# Patient Record
Sex: Female | Born: 1970 | Race: White | Hispanic: No | Smoking: Current some day smoker
Health system: Southern US, Community
[De-identification: ages and names within clinical notes are randomized; demographics above are authoritative.]

## PROBLEM LIST (undated history)

## (undated) DIAGNOSIS — F32A Depression, unspecified: Secondary | ICD-10-CM

## (undated) DIAGNOSIS — F329 Major depressive disorder, single episode, unspecified: Secondary | ICD-10-CM

## (undated) DIAGNOSIS — I10 Essential (primary) hypertension: Secondary | ICD-10-CM

## (undated) HISTORY — PX: BREAST SURGERY: SHX581

## (undated) HISTORY — PX: MOUTH SURGERY: SHX715

## (undated) HISTORY — PX: TONSILLECTOMY: SUR1361

## (undated) HISTORY — PX: AUGMENTATION MAMMAPLASTY: SUR837

---
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
## 2000-08-05 ENCOUNTER — Other Ambulatory Visit: Admission: RE | Admit: 2000-08-05 | Discharge: 2000-08-05 | Payer: Self-pay | Admitting: Obstetrics and Gynecology

## 2001-01-18 ENCOUNTER — Observation Stay (HOSPITAL_COMMUNITY): Admission: AD | Admit: 2001-01-18 | Discharge: 2001-01-19 | Payer: Self-pay | Admitting: Obstetrics and Gynecology

## 2001-01-19 ENCOUNTER — Encounter: Payer: Self-pay | Admitting: Obstetrics and Gynecology

## 2001-02-02 ENCOUNTER — Inpatient Hospital Stay (HOSPITAL_COMMUNITY): Admission: AD | Admit: 2001-02-02 | Discharge: 2001-02-04 | Payer: Self-pay | Admitting: Obstetrics and Gynecology

## 2001-03-15 ENCOUNTER — Other Ambulatory Visit: Admission: RE | Admit: 2001-03-15 | Discharge: 2001-03-15 | Payer: Self-pay | Admitting: Obstetrics and Gynecology

## 2002-07-05 ENCOUNTER — Other Ambulatory Visit: Admission: RE | Admit: 2002-07-05 | Discharge: 2002-07-05 | Payer: Self-pay | Admitting: Obstetrics and Gynecology

## 2003-05-27 ENCOUNTER — Inpatient Hospital Stay (HOSPITAL_COMMUNITY): Admission: AD | Admit: 2003-05-27 | Discharge: 2003-05-27 | Payer: Self-pay | Admitting: Pediatrics

## 2003-06-01 ENCOUNTER — Inpatient Hospital Stay (HOSPITAL_COMMUNITY): Admission: AD | Admit: 2003-06-01 | Discharge: 2003-06-02 | Payer: Self-pay | Admitting: Obstetrics & Gynecology

## 2003-07-17 ENCOUNTER — Other Ambulatory Visit: Admission: RE | Admit: 2003-07-17 | Discharge: 2003-07-17 | Payer: Self-pay | Admitting: Obstetrics and Gynecology

## 2004-10-12 ENCOUNTER — Other Ambulatory Visit: Admission: RE | Admit: 2004-10-12 | Discharge: 2004-10-12 | Payer: Self-pay | Admitting: Obstetrics and Gynecology

## 2006-05-10 ENCOUNTER — Ambulatory Visit (HOSPITAL_COMMUNITY): Admission: RE | Admit: 2006-05-10 | Discharge: 2006-05-10 | Payer: Self-pay | Admitting: Plastic Surgery

## 2006-05-10 ENCOUNTER — Ambulatory Visit (HOSPITAL_COMMUNITY): Admission: RE | Admit: 2006-05-10 | Discharge: 2006-05-10 | Payer: Self-pay | Admitting: Urology

## 2007-07-09 ENCOUNTER — Emergency Department (HOSPITAL_COMMUNITY): Admission: EM | Admit: 2007-07-09 | Discharge: 2007-07-09 | Payer: Self-pay | Admitting: Emergency Medicine

## 2010-09-18 NOTE — Op Note (Signed)
Gina Fuentes, SCALES NO.:  [id number]   MEDICAL RECORD NO.:  [id number]          PATIENT TYPE:  AMB   LOCATION:  SDC                           FACILITY:  [facility]   PHYSICIAN:  Consuello Bossier., M.D.DATE OF BIRTH:  01/23/1971   DATE OF PROCEDURE:  05/10/2006  DATE OF DISCHARGE:                               OPERATIVE REPORT   PREOPERATIVE DIAGNOSES:  Previous bilateral augmentation mammoplasty  with patient desirous of new implants being placed in the prepectoral  space after retropectoral implants removal.   POSTOPERATIVE DIAGNOSES:  Previous bilateral augmentation mammoplasty  with patient desirous of new implants being placed in the prepectoral  space after retropectoral implants removal.   SURGEON:  Pleas Patricia, M.D., plastic and reconstructive surgery.   ANESTHESIA:  General endotracheal.   OPERATIVE NOTE:  Please see Dr. Lenoria Chime operative note for his  surgical procedure.  The patient had been prepped with Betadine and  draped sterilely about both breasts, and had been given a general  endotracheal anesthetic.  The previous inferior circumareolar incisions  were incised and dissection continued down through the breast tissue  until the pectoralis major muscle was encountered.  On the right side,  the implant was cutaneous for a good portion.  There was some difference  between the left and the right side.  It was elected to dissect the  prepectoral space, removing the fascia in all directions.  The  dissection was continued down to 7 cm below the inframammary incision.  Once the space which had been marked out had been created, an incision  was made in the capsule on the right side and the implant removed, and  in the muscle on the left side, and that implant was removed.  The  wounds were irrigated with normal saline and there was noted to be  adequate hemostasis.  At this point, a new [place] smooth-walled saline-  filled prosthesis was  placed in this prepectoral space and the implant  inflated to 325 mL of normal saline bilaterally.  The breast tissue was  then closed in layers with interrupted #3 Monocryl, followed by a  running subcuticular #4 Monocryl.  The breast appeared to be symmetrical  in terms of implant position and mobility.  Xeroform, Steri-Strips, 4 x  8's, ABD and a circumthoracic Ace bandage were applied.  The patient  tolerated the procedure well and was able to be discharged from the  operating room to the recovery room, subsequently to be followed by me  as an outpatient.      Consuello Bossier., M.D.  Electronically Signed     HH/MEDQ  D:  05/10/2006  T:  05/10/2006  Job:  [id number]   cc:   Bertram Millard. Dahlstedt, M.D.

## 2010-09-18 NOTE — Op Note (Signed)
[name], Gina Fuentes NO.:  [id number]   MEDICAL RECORD NO.:  [id number]          PATIENT TYPE:  AMB   LOCATION:  SDC                           [facility]ACILITY:  [facility]   PHYSICIAN:  Bertram Millard. Dahlstedt, M.D.DATE OF BIRTH:  [birth year]   DATE OF PROCEDURE:  05/10/2006  DATE OF DISCHARGE:                               OPERATIVE REPORT   PREOPERATIVE DIAGNOSIS:  Stress urinary incontinence.   POSTOPERATIVE DIAGNOSIS:  Stress urinary incontinence.   PROCEDURE:  Lynx suprapubic sling.   SURGEON:  Bertram Millard. Dahlstedt, M.D.   ANESTHESIA:  General endotracheal.   COMPLICATIONS:  None.   ESTIMATED BLOOD LOSS:  50 mL.   SPECIMENS:  None.   BRIEF HISTORY:  A 40 year old female who recently presented to the  office with significant stress urinary incontinence.  She does not have  any other underlying bladder abnormalities.  She was evaluated.  I  discussed with her treatment options.  Physical therapy, surgical  therapy and medical therapy were discussed.  As the patient is having a  breast augmentation, she has desires surgical management.  She has been  given literature the [place] suprapubic sling.  She is aware of risks and  complications of this procedure.  These include but are not limited to  infection, bleeding, urinary retention, extrusion/erosion of the sling  material, among others.  She understands these and desires to proceed.   DESCRIPTION OF PROCEDURE:  The patient was administered preoperative IV  antibiotics.  She was identified in the holding area and taken to the  operating room, where a general anesthetic was administered.  PAS were  placed.  She was placed in the dorsal lithotomy position.  Genitalia,  lower abdomen and perineum were prepped and draped.  The bladder was  catheterized.  The anterior vaginal wall underlying the urethra was  infiltrated with 1% lidocaine with epinephrine, approximately 10 mL.  Two suprapubic punctures were made just to a  centimeter off the midline  above the pubis.  The anterior vaginal wall was incised along the course  of the urethra.  Flaps were raised bilaterally.  Blunt dissection was  used to help raise the flaps to the pubocervical fascia bilaterally.  The urethra was palpated in the middle.  At this point the suprapubic  needles were passed using assistance with a finger from below.  Once the  needles were passed on each side of the urethra, the cystoscopic  evaluation of the bladder and urethra was performed.  No bladder injury  was seen.  The [place] suprapubic sling was then grasped and brought  underneath the cradle of the urethra.  An approximate 1 cm of slack  was left underneath the urethra.  At this point the ends of the sling  were trimmed to underneath the skin level.  Dermabond was placed on the  suprapubic sites.  There was a mild amount of oozing from the vaginal  incision.  This stopped quite easily with closing the incision with 2-0  Vicryl in a simple running fashion.  A vaginal pack was left in  temporarily.  Approximately  100 mL of water was left in the bladder, to  enable a voiding trial later.   The patient tolerated the procedure well.  Dr. Stephens November then commenced  with his breast surgery.     At the end of the procedure, sponge, needle and instrument counts were  correct x2.      Bertram Millard. Dahlstedt, M.D.  Electronically Signed     SMD/MEDQ  D:  05/10/2006  T:  05/10/2006  Job:  [id number]   cc:   Dineen Kid. Rana Snare, M.D.  Fax: [phone]   Consuello Bossier., M.D.  Fax: [phone]

## 2012-04-15 ENCOUNTER — Emergency Department (HOSPITAL_COMMUNITY)
Admission: EM | Admit: 2012-04-15 | Discharge: 2012-04-15 | Disposition: A | Discharge status: Home / Self Care | Payer: Self-pay | Attending: Emergency Medicine | Admitting: Emergency Medicine

## 2012-04-15 ENCOUNTER — Encounter (HOSPITAL_COMMUNITY): Payer: Self-pay | Admitting: Emergency Medicine

## 2012-04-15 DIAGNOSIS — S61419A Laceration without foreign body of unspecified hand, initial encounter: Secondary | ICD-10-CM

## 2012-04-15 DIAGNOSIS — Z23 Encounter for immunization: Secondary | ICD-10-CM | POA: Insufficient documentation

## 2012-04-15 DIAGNOSIS — Y939 Activity, unspecified: Secondary | ICD-10-CM | POA: Insufficient documentation

## 2012-04-15 DIAGNOSIS — F17200 Nicotine dependence, unspecified, uncomplicated: Secondary | ICD-10-CM | POA: Insufficient documentation

## 2012-04-15 DIAGNOSIS — W1809XA Striking against other object with subsequent fall, initial encounter: Secondary | ICD-10-CM | POA: Insufficient documentation

## 2012-04-15 DIAGNOSIS — Y929 Unspecified place or not applicable: Secondary | ICD-10-CM | POA: Insufficient documentation

## 2012-04-15 DIAGNOSIS — S61409A Unspecified open wound of unspecified hand, initial encounter: Secondary | ICD-10-CM | POA: Insufficient documentation

## 2012-04-15 MED ORDER — LIDOCAINE-EPINEPHRINE (PF) 2 %-1:200000 IJ SOLN: 20.0000 mL | Freq: Once | INTRAMUSCULAR | Status: DC

## 2012-04-15 MED ORDER — CEPHALEXIN 500 MG PO CAPS: 500.0000 mg | ORAL_CAPSULE | Freq: Two times a day (BID) | ORAL | Status: DC

## 2012-04-15 MED ORDER — MORPHINE SULFATE 4 MG/ML IJ SOLN
4.0000 mg | Freq: Once | INTRAMUSCULAR | Status: AC
  Administered 2012-04-15: 4 mg via INTRAMUSCULAR
  Filled 2012-04-15: qty 1

## 2012-04-15 MED ORDER — BACITRACIN 500 UNIT/GM EX OINT
1.0000 "application " | TOPICAL_OINTMENT | Freq: Two times a day (BID) | CUTANEOUS | Status: DC
  Administered 2012-04-15: 1 via TOPICAL
  Filled 2012-04-15 (×2): qty 0.9

## 2012-04-15 MED ORDER — LIDOCAINE-EPINEPHRINE 2 %-1:100000 IJ SOLN: 20.0000 mL | Freq: Once | INTRAMUSCULAR | Status: DC

## 2012-04-15 MED ORDER — ONDANSETRON 4 MG PO TBDP
4.0000 mg | ORAL_TABLET | Freq: Once | ORAL | Status: AC
  Administered 2012-04-15: 4 mg via ORAL
  Filled 2012-04-15: qty 1

## 2012-04-15 MED ORDER — LIDOCAINE-EPINEPHRINE 2 %-1:100000 IJ SOLN
INTRAMUSCULAR | Status: AC
  Filled 2012-04-15: qty 1

## 2012-04-15 MED ORDER — TETANUS-DIPHTH-ACELL PERTUSSIS 5-2.5-18.5 LF-MCG/0.5 IM SUSP
0.5000 mL | Freq: Once | INTRAMUSCULAR | Status: AC
  Administered 2012-04-15: 0.5 mL via INTRAMUSCULAR
  Filled 2012-04-15: qty 0.5

## 2012-04-15 MED ORDER — BACITRACIN ZINC 500 UNIT/GM EX OINT: 1.0000 "application " | TOPICAL_OINTMENT | Freq: Two times a day (BID) | CUTANEOUS | Status: DC

## 2012-04-15 NOTE — ED Notes (Signed)
Pt presents to Ed with a laceration to the left hand.  Laceration begins at the base of the thumb and extends to her palm.  Pt cut hand on a kerosene heater.

## 2012-04-15 NOTE — ED Provider Notes (Signed)
History     CSN: [id number]  Arrival date & time 04/15/12  0018   First MD Initiated Contact with Patient 04/15/12 0022      Chief Complaint  Patient presents with  . Extremity Laceration    (Consider location/radiation/quality/duration/timing/severity/associated sxs/prior treatment) HPI  Gina Fuentes is a 41 y.o. female complaining of laceration to thenar emmenece of left hand earlier in the evening she fell and her hand impacted a kerosene heater. Bleeding is not controlled, tetanus is not up-to-date, patient denies change in range of motion or paresthesia. Pain is rated at 6/10 and exacerbated with movement.  History reviewed. No pertinent past medical history.  History reviewed. No pertinent past surgical history.  History reviewed. No pertinent family history.  History  Substance Use Topics  . Smoking status: Current Some Day Smoker -- 0.5 packs/day  . Smokeless tobacco: Not on file  . Alcohol Use: 1.2 oz/week    2 Cans of beer per week    OB History    Grav Para Term Preterm Abortions TAB SAB Ect Mult Living                  Review of Systems  Constitutional: Negative for fever.  Respiratory: Negative for shortness of breath.   Cardiovascular: Negative for chest pain.  Gastrointestinal: Negative for nausea, vomiting, abdominal pain and diarrhea.  Skin: Positive for wound.  All other systems reviewed and are negative.    Allergies  Penicillins  Home Medications  No current outpatient prescriptions on file.  BP 129/83  Pulse 79  Temp 98.6 F (37 C) (Oral)  Resp 18  SpO2 97%  Physical Exam  Nursing note and vitals reviewed. Constitutional: She is oriented to person, place, and time. She appears well-developed and well-nourished. No distress.  HENT:  Head: Normocephalic.  Eyes: Conjunctivae normal and EOM are normal.  Cardiovascular: Normal rate.   Pulmonary/Chest: Effort normal. No stridor.  Musculoskeletal: Normal range of motion.   Patient has full range of motion to flexion and extension of each interphalangeal joint. Distal sensation is grossly intact however she reports a paresthesia to digits 1-3  Neurological: She is alert and oriented to person, place, and time.  Skin:       6 cm full-thickness laceration to thenar eminence with stellate distal ends. Muscle and fascia are exposed but with no intrusion  Psychiatric: She has a normal mood and affect.    ED Course  Procedures (including critical care time)  LACERATION REPAIR Performed by: Wynetta Emery Authorized by: Wynetta Emery Consent: Verbal consent obtained. Risks and benefits: risks, benefits and alternatives were discussed Consent given by: patient Patient identity confirmed: Wrist band  Prepped and Draped in normal sterile fashion  Tetanus: Updated  Laceration Location: Left thenar eminence  Laceration Length: 6 cm  Anesthesia: Local   Local anesthetic: 2% with epinephrine  Anesthetic total: 10 ml  Irrigation method: syringe  Amount of cleaning: copious   Wound explored to depth in good light, on a bloodless field with no foreign bodies seen or palpated.   Wound closed in 2 layers the deep layer closed with 3-0 Vicryl Rapide in a figure-of-eight.  Superficial Skin closure: 4-0 polypropylene   Number of sutures: 10   Technique: Interrupted   Patient tolerance: Patient tolerated the procedure well with no immediate complications.  Antibx ointment applied. Instructions for care discussed verbally and patient provided with additional written instructions for homecare and f/u.  Labs Reviewed - No data to display  No results found.   1. Hand laceration       MDM  Left hand laceration closed in 2 layers, tetanus updated. Patient will be given a referral to hand surgeon Dr. Mina Marble.  This is a shared visit with attending Dr. Dierdre Highman.   Pt verbalized understanding and agrees with care plan. Outpatient follow-up and  return precautions given.    New Prescriptions   CEPHALEXIN (KEFLEX) 500 MG CAPSULE    Take 1 capsule (500 mg total) by mouth 2 (two) times daily.         Wynetta Emery, PA-C 04/16/12 1459

## 2012-04-16 NOTE — ED Provider Notes (Signed)
Medical screening examination/treatment/procedure(s) were performed by non-physician practitioner and as supervising physician I was immediately available for consultation/collaboration.  Essence Nielsen, MD 04/16/12 [phone]

## 2012-04-21 ENCOUNTER — Emergency Department (HOSPITAL_COMMUNITY)
Admission: EM | Admit: 2012-04-21 | Discharge: 2012-04-21 | Disposition: A | Discharge status: Home / Self Care | Payer: Self-pay | Attending: Emergency Medicine | Admitting: Emergency Medicine

## 2012-04-21 ENCOUNTER — Encounter (HOSPITAL_COMMUNITY): Payer: Self-pay | Admitting: Emergency Medicine

## 2012-04-21 DIAGNOSIS — F17200 Nicotine dependence, unspecified, uncomplicated: Secondary | ICD-10-CM | POA: Insufficient documentation

## 2012-04-21 DIAGNOSIS — S61412A Laceration without foreign body of left hand, initial encounter: Secondary | ICD-10-CM

## 2012-04-21 DIAGNOSIS — Y929 Unspecified place or not applicable: Secondary | ICD-10-CM | POA: Insufficient documentation

## 2012-04-21 DIAGNOSIS — IMO0002 Reserved for concepts with insufficient information to code with codable children: Secondary | ICD-10-CM | POA: Insufficient documentation

## 2012-04-21 DIAGNOSIS — S61409A Unspecified open wound of unspecified hand, initial encounter: Secondary | ICD-10-CM | POA: Insufficient documentation

## 2012-04-21 DIAGNOSIS — Z79899 Other long term (current) drug therapy: Secondary | ICD-10-CM | POA: Insufficient documentation

## 2012-04-21 DIAGNOSIS — Y9301 Activity, walking, marching and hiking: Secondary | ICD-10-CM | POA: Insufficient documentation

## 2012-04-21 MED ORDER — OXYCODONE-ACETAMINOPHEN 5-325 MG PO TABS
1.0000 | ORAL_TABLET | Freq: Once | ORAL | Status: AC
  Administered 2012-04-21: 1 via ORAL
  Filled 2012-04-21: qty 1

## 2012-04-21 MED ORDER — LIDOCAINE-EPINEPHRINE-TETRACAINE (LET) SOLUTION
3.0000 mL | Freq: Once | NASAL | Status: AC
  Administered 2012-04-21: 3 mL via TOPICAL
  Filled 2012-04-21: qty 3

## 2012-04-21 NOTE — ED Provider Notes (Signed)
History     CSN: [id number]  Arrival date & time 04/21/12  1542   First MD Initiated Contact with Patient 04/21/12 1554      Chief Complaint  Patient presents with  . Extremity Laceration  . Wound Check    sutures came out on day 6.Wound reopened    (Consider location/radiation/quality/duration/timing/severity/associated sxs/prior treatment) HPI  41 year old Female presents for a wound reevaluation.  Patient had a laceration to her left thenar eminence 6 days ago in which she was treated in the ED with sutures and Keflex. She is here today because her suture became undone.  Report she was walking her dog when the dog pulled on the leash in which she was holding with her L hand.  Subsequently she noticed 3-4 of her sutures came undone, and she proceed to remove the rest of the suture.  C/o of sharp, throbbing pain to affected area, non radiating.  No fever, rash, extended red streak.  Pt is RHD.  Pain has been well controlled prior to the recent incident.   No past medical history on file.  No past surgical history on file.  No family history on file.  History  Substance Use Topics  . Smoking status: Current Some Day Smoker -- 0.5 packs/day  . Smokeless tobacco: Not on file  . Alcohol Use: 1.2 oz/week    2 Cans of beer per week    OB History    Grav Para Term Preterm Abortions TAB SAB Ect Mult Living                  Review of Systems  Constitutional: Negative for fever.  Skin: Positive for wound.  Neurological: Negative for numbness.    Allergies  Penicillins  Home Medications   Current Outpatient Rx  Name  Route  Sig  Dispense  Refill  . CEPHALEXIN 500 MG PO CAPS   Oral   Take 1 capsule (500 mg total) by mouth 2 (two) times daily.   20 capsule   0     BP 136/74  Pulse 89  Temp 98.2 F (36.8 C) (Oral)  Resp 16  SpO2 99%  Physical Exam  Nursing note and vitals reviewed. Constitutional: She is oriented to person, place, and time. She appears  well-developed and well-nourished. No distress.  Eyes: Conjunctivae normal are normal.  Neck: Neck supple.  Musculoskeletal: Normal range of motion.       L thenar eminence wound is dehisced.  No sutures seen.  No signs of infection, not actively bleeding.  Macerated skin at border, ttp, no abscess or pus.  No fb.   Neurological: She is alert and oriented to person, place, and time.  Skin: Skin is warm. No rash noted.    ED Course  Procedures (including critical care time)  LACERATION REPAIR Performed by: Fayrene Helper Authorized by: Fayrene Helper Consent: Verbal consent obtained. Risks and benefits: risks, benefits and alternatives were discussed Consent given by: patient Patient identity confirmed: provided demographic data Prepped and Draped in normal sterile fashion Wound explored  Laceration Location: L thenar eminence  Laceration Length: 6cm  No Foreign Bodies seen or palpated  Anesthesia: LET  Local anesthetic: LET  Anesthetic total: 3 ml  Irrigation method: syringe Amount of cleaning: standard  Skin closure: sterile strip  Number of sutures: sterile strip  Technique: sterile strip  Patient tolerance: Patient tolerated the procedure well with no immediate complications.  1. Wound, L hand  MDM  Pt has wound laceration on her  L palm of hand which dehisced today.  No signs of infection or active bleeding.  Plan to cleanse wound with normal saline, and will sterile strip the wound, and will offer hand splint to provide protection to wound.  Pain medication given.    4:57 PM Pt to wear velcro splint for protection.  Wound care instruction given.    BP 136/74  Pulse 89  Temp 98.2 F (36.8 C) (Oral)  Resp 16  SpO2 99%  I have reviewed nursing notes and vital signs.  I reviewed available ER/hospitalization records thought the EMR      Fayrene Helper, [place] 04/21/12 1657

## 2012-04-22 NOTE — ED Provider Notes (Signed)
Medical screening examination/treatment/procedure(s) were performed by non-physician practitioner and as supervising physician I was immediately available for consultation/collaboration.   Francisca Harbuck III, MD 04/22/12 1402 

## 2013-06-28 ENCOUNTER — Encounter (HOSPITAL_COMMUNITY): Payer: Self-pay | Admitting: Emergency Medicine

## 2013-06-28 ENCOUNTER — Emergency Department (HOSPITAL_COMMUNITY)
Admission: EM | Admit: 2013-06-28 | Discharge: 2013-06-28 | Disposition: A | Discharge status: Home / Self Care | Payer: Self-pay | Attending: Emergency Medicine | Admitting: Emergency Medicine

## 2013-06-28 DIAGNOSIS — F17200 Nicotine dependence, unspecified, uncomplicated: Secondary | ICD-10-CM | POA: Insufficient documentation

## 2013-06-28 DIAGNOSIS — Y929 Unspecified place or not applicable: Secondary | ICD-10-CM | POA: Insufficient documentation

## 2013-06-28 DIAGNOSIS — W260XXA Contact with knife, initial encounter: Secondary | ICD-10-CM | POA: Insufficient documentation

## 2013-06-28 DIAGNOSIS — S61219A Laceration without foreign body of unspecified finger without damage to nail, initial encounter: Secondary | ICD-10-CM

## 2013-06-28 DIAGNOSIS — S61209A Unspecified open wound of unspecified finger without damage to nail, initial encounter: Secondary | ICD-10-CM | POA: Insufficient documentation

## 2013-06-28 DIAGNOSIS — Y9389 Activity, other specified: Secondary | ICD-10-CM | POA: Insufficient documentation

## 2013-06-28 DIAGNOSIS — Z880 Allergy status to penicillin: Secondary | ICD-10-CM | POA: Insufficient documentation

## 2013-06-28 DIAGNOSIS — Z23 Encounter for immunization: Secondary | ICD-10-CM | POA: Insufficient documentation

## 2013-06-28 DIAGNOSIS — W261XXA Contact with sword or dagger, initial encounter: Secondary | ICD-10-CM

## 2013-06-28 MED ORDER — TETANUS-DIPHTH-ACELL PERTUSSIS 5-2.5-18.5 LF-MCG/0.5 IM SUSP
0.5000 mL | Freq: Once | INTRAMUSCULAR | Status: AC
  Administered 2013-06-28: 0.5 mL via INTRAMUSCULAR
  Filled 2013-06-28: qty 0.5

## 2013-06-28 MED ORDER — HYDROCODONE-ACETAMINOPHEN 5-325 MG PO TABS: 1.0000 | ORAL_TABLET | ORAL | Status: DC | PRN

## 2013-06-28 NOTE — ED Provider Notes (Signed)
CSN: [id number]     Arrival date & time 06/28/13  0047 History   First MD Initiated Contact with Patient 06/28/13 [phone]     Chief Complaint  Patient presents with  . Extremity Laceration     (Consider location/radiation/quality/duration/timing/severity/associated sxs/prior Treatment) HPI History provided by pt.   Pt was cutting twine off of hay bales at 10pm last night and sustained a laceration to her left index finger.  Moderate pain.  Bleeding is not controlled.  Cleaned with running water.  Tetanus is out of date.  No pertinent PMH. History reviewed. No pertinent past medical history. Past Surgical History  Procedure Laterality Date  . Breast surgery    . Tonsillectomy    . Mouth surgery     History reviewed. No pertinent family history. History  Substance Use Topics  . Smoking status: Current Some Day Smoker -- 0.50 packs/day  . Smokeless tobacco: Not on file  . Alcohol Use: 1.2 oz/week    2 Cans of beer per week   OB History   Grav Para Term Preterm Abortions TAB SAB Ect Mult Living                 Review of Systems  All other systems reviewed and are negative.      Allergies  Penicillins  Home Medications  No current outpatient prescriptions on file. BP 148/87  Pulse 82  Temp(Src) 98.1 F (36.7 C) (Oral)  SpO2 98% Physical Exam  Nursing note and vitals reviewed. Constitutional: She is oriented to person, place, and time. She appears well-developed and well-nourished. No distress.  HENT:  Head: Normocephalic and atraumatic.  Eyes:  Normal appearance  Neck: Normal range of motion.  Pulmonary/Chest: Effort normal.  Musculoskeletal: Normal range of motion.  2cm horizontal, hemostatic, subq lac palmar surface distal phalanx left index finger.  Distal sensation intact.   Neurological: She is alert and oriented to person, place, and time.  Psychiatric: She has a normal mood and affect. Her behavior is normal.    ED Course  Procedures (including critical  care time) LACERATION REPAIR Performed by: Otilio MiuSCHINLEVER, Orvella Digiulio E Authorized by: Ruby ColaSCHINLEVER, Pari Lombard E Consent: Verbal consent obtained. Risks and benefits: risks, benefits and alternatives were discussed Consent given by: patient Patient identity confirmed: provided demographic data Prepped and Draped in normal sterile fashion Wound explored  Laceration Location: left index finger  Laceration Length: 2cm  No Foreign Bodies seen or palpated  Anesthesia: local infiltration  Digital block anesthetic: lidocaine 2% w/out epinephrine  Anesthetic total: 5 ml  Irrigation method: syringe Amount of cleaning: standard  Skin closure: prolene 4.0 and dermabond  Number of sutures: 5  Technique: simple interrupted  Patient tolerance: Patient tolerated the procedure well with no immediate complications.  Labs Review Labs Reviewed - No data to display Imaging Review No results found.  EKG Interpretation   None       MDM   Final diagnoses:  Laceration of finger    43yo healthy F presents w/ left index finger lac.  Wound cleaned and sutured and tetanus updated.  D/c'd home w/ 10 vicodin for pain.  Return precautions discussed.     Otilio Miuatherine E Lillyanna Glandon, PA-C 06/28/13 [phone]

## 2013-06-28 NOTE — ED Notes (Signed)
PA at bedside.

## 2013-06-28 NOTE — ED Provider Notes (Signed)
Medical screening examination/treatment/procedure(s) were performed by non-physician practitioner and as supervising physician I was immediately available for consultation/collaboration.  EKG Interpretation   None        Shweta Aman K Reene Harlacher-Rasch, MD 06/28/13 [phone]

## 2013-06-28 NOTE — Discharge Instructions (Signed)
Take vicodin as prescribed for severe pain.   Do not drive within four hours of taking this medication (may cause drowsiness or confusion).  Keep wound clean.  [facility] with your primary care doctor or [facility] [phone]; [address]) or return to the ER in 7-10 days for wound recheck and suture removal.  You should be seen sooner if you develop fever, worsening pain or redness/drainage of pus at site of wound.

## 2013-06-28 NOTE — ED Notes (Signed)
Pt states that she was carrying a knife while getting hay up for her horses and slipped and cut her L pointer finger. Bleeding not controlled at this time.

## 2015-02-03 ENCOUNTER — Encounter (HOSPITAL_COMMUNITY): Payer: Self-pay | Admitting: Emergency Medicine

## 2015-02-03 ENCOUNTER — Emergency Department (HOSPITAL_COMMUNITY): Payer: Medicaid Other

## 2015-02-03 ENCOUNTER — Emergency Department (HOSPITAL_COMMUNITY)
Admission: EM | Admit: 2015-02-03 | Discharge: 2015-02-03 | Disposition: A | Discharge status: Home / Self Care | Payer: Medicaid Other | Attending: Emergency Medicine | Admitting: Emergency Medicine

## 2015-02-03 DIAGNOSIS — Y9352 Activity, horseback riding: Secondary | ICD-10-CM | POA: Insufficient documentation

## 2015-02-03 DIAGNOSIS — Y9289 Other specified places as the place of occurrence of the external cause: Secondary | ICD-10-CM | POA: Insufficient documentation

## 2015-02-03 DIAGNOSIS — S89192A Other physeal fracture of lower end of left tibia, initial encounter for closed fracture: Secondary | ICD-10-CM | POA: Insufficient documentation

## 2015-02-03 DIAGNOSIS — S89392A Other physeal fracture of lower end of left fibula, initial encounter for closed fracture: Secondary | ICD-10-CM | POA: Diagnosis not present

## 2015-02-03 DIAGNOSIS — Z720 Tobacco use: Secondary | ICD-10-CM | POA: Diagnosis not present

## 2015-02-03 DIAGNOSIS — Z880 Allergy status to penicillin: Secondary | ICD-10-CM | POA: Diagnosis not present

## 2015-02-03 DIAGNOSIS — S82202A Unspecified fracture of shaft of left tibia, initial encounter for closed fracture: Secondary | ICD-10-CM

## 2015-02-03 DIAGNOSIS — S8992XA Unspecified injury of left lower leg, initial encounter: Secondary | ICD-10-CM | POA: Diagnosis present

## 2015-02-03 DIAGNOSIS — S80812A Abrasion, left lower leg, initial encounter: Secondary | ICD-10-CM | POA: Insufficient documentation

## 2015-02-03 DIAGNOSIS — Y998 Other external cause status: Secondary | ICD-10-CM | POA: Insufficient documentation

## 2015-02-03 DIAGNOSIS — Z23 Encounter for immunization: Secondary | ICD-10-CM | POA: Insufficient documentation

## 2015-02-03 DIAGNOSIS — S82402A Unspecified fracture of shaft of left fibula, initial encounter for closed fracture: Secondary | ICD-10-CM

## 2015-02-03 LAB — CBC WITH DIFFERENTIAL/PLATELET
BASOS ABS: 0 10*3/uL (ref 0.0–0.1)
BASOS PCT: 0 %
EOS ABS: 0.1 10*3/uL (ref 0.0–0.7)
EOS PCT: 1 %
HCT: 39.4 % (ref 36.0–46.0)
Hemoglobin: 13.4 g/dL (ref 12.0–15.0)
Lymphocytes Relative: 27 %
Lymphs Abs: 2.9 10*3/uL (ref 0.7–4.0)
MCH: 34.4 pg — ABNORMAL HIGH (ref 26.0–34.0)
MCHC: 34 g/dL (ref 30.0–36.0)
MCV: 101 fL — ABNORMAL HIGH (ref 78.0–100.0)
MONO ABS: 0.9 10*3/uL (ref 0.1–1.0)
MONOS PCT: 9 %
NEUTROS ABS: 6.6 10*3/uL (ref 1.7–7.7)
Neutrophils Relative %: 63 %
PLATELETS: 312 10*3/uL (ref 150–400)
RBC: 3.9 MIL/uL (ref 3.87–5.11)
RDW: 13.3 % (ref 11.5–15.5)
WBC: 10.6 10*3/uL — ABNORMAL HIGH (ref 4.0–10.5)

## 2015-02-03 LAB — BASIC METABOLIC PANEL
ANION GAP: 12 (ref 5–15)
BUN: <5 mg/dL — ABNORMAL LOW (ref 6–20)
CALCIUM: 9 mg/dL (ref 8.9–10.3)
CO2: 22 mmol/L (ref 22–32)
CREATININE: 0.53 mg/dL (ref 0.44–1.00)
Chloride: 102 mmol/L (ref 101–111)
Glucose, Bld: 102 mg/dL — ABNORMAL HIGH (ref 65–99)
Potassium: 5 mmol/L (ref 3.5–5.1)
SODIUM: 136 mmol/L (ref 135–145)

## 2015-02-03 MED ORDER — OXYCODONE HCL 5 MG PO TABS: 5.0000 mg | ORAL_TABLET | ORAL | Status: DC | PRN

## 2015-02-03 MED ORDER — OXYCODONE-ACETAMINOPHEN 5-325 MG PO TABS
1.0000 | ORAL_TABLET | Freq: Once | ORAL | Status: AC
  Administered 2015-02-03: 1 via ORAL
  Filled 2015-02-03: qty 1

## 2015-02-03 MED ORDER — TETANUS-DIPHTH-ACELL PERTUSSIS 5-2.5-18.5 LF-MCG/0.5 IM SUSP
0.5000 mL | Freq: Once | INTRAMUSCULAR | Status: AC
  Administered 2015-02-03: 0.5 mL via INTRAMUSCULAR
  Filled 2015-02-03: qty 0.5

## 2015-02-03 MED ORDER — SODIUM CHLORIDE 0.9 % IV BOLUS (SEPSIS)
1000.0000 mL | Freq: Once | INTRAVENOUS | Status: AC
  Administered 2015-02-03: 1000 mL via INTRAVENOUS

## 2015-02-03 MED ORDER — HYDROMORPHONE HCL 1 MG/ML IJ SOLN
1.0000 mg | Freq: Once | INTRAMUSCULAR | Status: AC
  Administered 2015-02-03: 1 mg via INTRAVENOUS
  Filled 2015-02-03: qty 1

## 2015-02-03 MED ORDER — FENTANYL CITRATE (PF) 100 MCG/2ML IJ SOLN
100.0000 ug | Freq: Once | INTRAMUSCULAR | Status: AC
  Administered 2015-02-03: 100 ug via INTRAVENOUS
  Filled 2015-02-03: qty 2

## 2015-02-03 MED ORDER — CEFAZOLIN SODIUM 1-5 GM-% IV SOLN
1.0000 g | Freq: Once | INTRAVENOUS | Status: AC
  Administered 2015-02-03: 1 g via INTRAVENOUS
  Filled 2015-02-03: qty 50

## 2015-02-03 NOTE — ED Notes (Signed)
Pt here after falling off a horse landing on her left leg ,left  Lower leg with deformity , pulses present, no loc

## 2015-02-03 NOTE — ED Notes (Signed)
Discharge instructions given voiced understanding

## 2015-02-03 NOTE — ED Notes (Signed)
99% on 2 liters via [place]

## 2015-02-03 NOTE — Progress Notes (Signed)
Orthopedic Tech Progress Note Patient Details:  Gina Fuentes Jul 15, 1970 [id number] Applied fiberglass posterior short leg splint to LLE.  Pulses, sensation, motion intact before and after splinting.  Capillary refill less than 2 seconds before and after splinting.  Fit pt. for crutches and taught use for same. Ortho Devices Type of Ortho Device: Crutches, Post (short leg) splint Ortho Device/Splint Location: LLE Ortho Device/Splint Interventions: Application   Lesle Chris 02/03/2015, 8:48 PM

## 2015-02-03 NOTE — ED Notes (Signed)
Ortho @ bedside to apply splint.

## 2015-02-03 NOTE — ED Notes (Signed)
Pt placed on 2 liters O2 n/c  

## 2015-02-03 NOTE — ED Provider Notes (Signed)
CSN: [id number]     Arrival date & time 02/03/15  1806 History   First MD Initiated Contact with Patient 02/03/15 1810     Chief Complaint  Patient presents with  . Leg Injury    The history is provided by the patient, a significant other, a parent, the EMS personnel and medical records.     44 year old female with history of EtOH abuse (reported by pt's mother) presenting with leg pain. Onset was earlier today, suddenly. Context- patient was drinking EtOH and lost balance and fell off of horse, landing on her left ankle, which then collapsed underneath her. Denies associated head injury, LOC, or other associated injuries. Located in left lower leg. Severe. Worse with movement. Associated with obvious deformity and abrasion over her anterior shin. Denies numbness, weakness distally.  History reviewed. No pertinent past medical history. Past Surgical History  Procedure Laterality Date  . Breast surgery    . Tonsillectomy    . Mouth surgery     History reviewed. No pertinent family history. Social History  Substance Use Topics  . Smoking status: Current Some Day Smoker -- 0.50 packs/day  . Smokeless tobacco: None  . Alcohol Use: 1.2 oz/week    2 Cans of beer per week   OB History    No data available     Review of Systems  Constitutional: Negative for fever.  HENT: Negative for rhinorrhea.   Eyes: Negative for visual disturbance.  Respiratory: Negative for shortness of breath.   Cardiovascular: Negative for chest pain.  Gastrointestinal: Negative for vomiting and abdominal pain.  Genitourinary: Negative for decreased urine volume.  Musculoskeletal: Positive for joint swelling and arthralgias.  Skin: Positive for wound.  Allergic/Immunologic: Negative for immunocompromised state.  Neurological: Negative for syncope.  Psychiatric/Behavioral: Negative for confusion.      Allergies  Penicillins  Home Medications   Prior to Admission medications   Medication Sig Start  Date End Date Taking? Authorizing Provider  oxyCODONE (ROXICODONE) 5 MG immediate release tablet Take 1-2 tablets (5-10 mg total) by mouth every 4 (four) hours as needed for severe pain. 02/03/15   Urban Gibson, MD   BP 151/83 mmHg  Pulse 70  Temp(Src) 99 F (37.2 C) (Oral)  Resp 23  SpO2 98% Physical Exam  Constitutional: She is oriented to person, place, and time. She appears well-developed and well-nourished. No distress.  HENT:  Head: Normocephalic and atraumatic.  Eyes: Right eye exhibits no discharge. Left eye exhibits no discharge.  Neck: No tracheal deviation present.  Cardiovascular: Normal rate and regular rhythm.   Pulmonary/Chest: Effort normal and breath sounds normal. No respiratory distress.  Abdominal: Soft. She exhibits no distension. There is no tenderness.  Musculoskeletal: She exhibits no edema.  LLE: obvious deformity at the lower leg with intermittent tenting and area of small abrasion without clear puncture wound communicating with bone underlying this area. Crepitus. TTP and light movement. Distally NV intact with intact sensation at all dermatomes, ankle and great toe PF/DF, 2+ DP and PT pulse, toes warm and well perfused. Knee intact flex/ext 5/5 without difficulty and no joint effusion evident.  No spinal ttp, pelvis stable to anterior and lateral compression, other limbs atraumatic with 2+ distal pulses  Neurological: She is alert and oriented to person, place, and time.  Skin: Skin is warm and dry.  Psychiatric: She has a normal mood and affect. Her behavior is normal.    ED Course  ORTHOPEDIC INJURY TREATMENT Performed by: Urban Gibson Authorized by: Cristi Loron  Anorah Trias Consent: The procedure was performed in an emergent situation. Consent given by: patient and parent Patient identity confirmed: arm band, provided demographic data and hospital-assigned identification number Time out: Immediately prior to procedure a "time out" was called to verify the correct  patient, procedure, equipment, support staff and site/side marked as required. Injury location: lower leg Location details: left lower leg Injury type: fracture Fracture type: tibial and fibular shafts Pre-procedure neurovascular assessment: neurovascularly intact Pre-procedure distal perfusion: normal Pre-procedure neurological function: normal Manipulation performed: yes Skeletal traction used: yes Reduction successful: yes Immobilization: splint Splint type: short leg Post-procedure neurovascular assessment: post-procedure neurovascularly intact Patient tolerance: Patient tolerated the procedure well with no immediate complications   (including critical care time) Labs Review Labs Reviewed  CBC WITH DIFFERENTIAL/PLATELET - Abnormal; Notable for the following:    WBC 10.6 (*)    MCV 101.0 (*)    MCH 34.4 (*)    All other components within normal limits  BASIC METABOLIC PANEL - Abnormal; Notable for the following:    Glucose, Bld 102 (*)    BUN <5 (*)    All other components within normal limits    Imaging Review Dg Tibia/fibula Left  02/03/2015   CLINICAL DATA:  [place]are[place] Seat from horse.  EXAM: LEFT TIBIA AND FIBULA - 2 VIEW  COMPARISON:  None.  FINDINGS: There are distal diaphyseal fractures of the tibia and fibula approximately 8-9 cm above the tibiotalar joint line. The tibial fracture is comminuted with a posterior butterfly fragment. There is 1/2 shaft width lateral displacement at the tibial fracture with mild apex-anterior angulation. The fibular fracture is displaced a full shaft width laterally. There also is a coronally oriented intra-articular distal tibial fracture which is nearly nondisplaced. There is an oblique proximal fibular fracture which is displaced a little more than 1 cortex width anterolaterally.  IMPRESSION: 1. Fractures of the distal tibial and distal fibular diaphyses 8-9 cm above the ankle joint. 2. Coronally oriented intra-articular fracture at the distal tibia  posteriorly, nondisplaced. 3. Proximal fibular fracture.   Electronically Signed   By: Ellery Plunk M.D.   On: 02/03/2015 19:28   Dg Knee Complete 4 Views Left  02/03/2015   CLINICAL DATA:  Fall from horse. Left leg deformity. Initial encounter.  EXAM: LEFT KNEE - COMPLETE 4+ VIEW  COMPARISON:  None.  FINDINGS: There is an acute, mildly comminuted and mildly displaced oblique fracture of the fibular neck. This demonstrates no intra-articular extension. The proximal tibia, distal femur and patella appear intact. No significant joint effusion.  IMPRESSION: Mildly displaced oblique fracture of the left fibular neck.   Electronically Signed   By: Carey Bullocks M.D.   On: 02/03/2015 19:23   Dg Foot Complete Left  02/03/2015   CLINICAL DATA:  44 year old who fell off of a horse earlier today and sustained injuries to the left lower leg. Initial encounter.  EXAM: LEFT FOOT - COMPLETE 2 VIEW  COMPARISON:  Left tibia=fibula x-rays obtained concurrently.  FINDINGS: No visible fractures involving the bones of the foot. Joint spaces well preserved. Distal fibular fracture will be detailed on the report of the tibia-fibula x-rays.  IMPRESSION: No visible fractures involving the bones of the foot.   Electronically Signed   By: Hulan Saas M.D.   On: 02/03/2015 19:24   I have personally reviewed and evaluated these images and lab results as part of my medical decision-making.   EKG Interpretation None      MDM   Final diagnoses:  Tibia/fibula fracture, left, closed, initial encounter    44 yo F presenting with leg injury after falling on ankle from horse. No other associated injuries identified. Appears to be closed injury though with superficial abrasion over area with bone fragment palpable underneath so given ancef. Tetanus updated. Nv intact. Discussed case with Dr. Lajoyce Corners who recommended manipulation to improve alignment, placement of short leg splint, f/u in clinic tomorrow as emergent surgery  would not be advised with amount of swelling present. Update pt on plan. Manipulation and splint as above. Nv intact following. Dc with pain meds, f/u tomorrow, very strict return precautions.   Case discussed with Dr. Clayborne Dana who oversaw management of this patient.      Urban Gibson, MD 02/04/15 1914  Marily Memos, MD 02/05/15 [phone]

## 2015-02-04 ENCOUNTER — Inpatient Hospital Stay (HOSPITAL_COMMUNITY): Payer: Medicaid Other | Admitting: Anesthesiology

## 2015-02-04 ENCOUNTER — Observation Stay (HOSPITAL_COMMUNITY): Payer: Medicaid Other

## 2015-02-04 ENCOUNTER — Encounter (HOSPITAL_COMMUNITY): Payer: Self-pay | Admitting: *Deleted

## 2015-02-04 ENCOUNTER — Observation Stay (HOSPITAL_COMMUNITY)
Admission: AD | Admit: 2015-02-04 | Discharge: 2015-02-05 | Disposition: A | Discharge status: Home / Self Care | Payer: Medicaid Other | Source: Ambulatory Visit | Attending: Orthopedic Surgery | Admitting: Orthopedic Surgery

## 2015-02-04 ENCOUNTER — Encounter (HOSPITAL_COMMUNITY)
Admission: AD | Disposition: A | Discharge status: Home / Self Care | Payer: Self-pay | Source: Ambulatory Visit | Attending: Orthopedic Surgery

## 2015-02-04 ENCOUNTER — Other Ambulatory Visit (HOSPITAL_COMMUNITY): Payer: Self-pay | Admitting: Orthopedic Surgery

## 2015-02-04 DIAGNOSIS — S82892A Other fracture of left lower leg, initial encounter for closed fracture: Secondary | ICD-10-CM | POA: Diagnosis not present

## 2015-02-04 DIAGNOSIS — S82202A Unspecified fracture of shaft of left tibia, initial encounter for closed fracture: Secondary | ICD-10-CM | POA: Diagnosis not present

## 2015-02-04 DIAGNOSIS — Z7982 Long term (current) use of aspirin: Secondary | ICD-10-CM | POA: Insufficient documentation

## 2015-02-04 DIAGNOSIS — F17200 Nicotine dependence, unspecified, uncomplicated: Secondary | ICD-10-CM | POA: Insufficient documentation

## 2015-02-04 DIAGNOSIS — S82402A Unspecified fracture of shaft of left fibula, initial encounter for closed fracture: Principal | ICD-10-CM | POA: Insufficient documentation

## 2015-02-04 DIAGNOSIS — Z419 Encounter for procedure for purposes other than remedying health state, unspecified: Secondary | ICD-10-CM

## 2015-02-04 DIAGNOSIS — S82409A Unspecified fracture of shaft of unspecified fibula, initial encounter for closed fracture: Secondary | ICD-10-CM

## 2015-02-04 DIAGNOSIS — S82209A Unspecified fracture of shaft of unspecified tibia, initial encounter for closed fracture: Secondary | ICD-10-CM | POA: Diagnosis present

## 2015-02-04 HISTORY — PX: TIBIA IM NAIL INSERTION: SHX2516

## 2015-02-04 SURGERY — INSERTION, INTRAMEDULLARY ROD, TIBIA
Anesthesia: General | Site: Leg Lower | Laterality: Left

## 2015-02-04 MED ORDER — ROCURONIUM BROMIDE 100 MG/10ML IV SOLN
INTRAVENOUS | Status: DC | PRN
  Administered 2015-02-04: 40 mg via INTRAVENOUS

## 2015-02-04 MED ORDER — METHOCARBAMOL 500 MG PO TABS
500.0000 mg | ORAL_TABLET | Freq: Four times a day (QID) | ORAL | Status: DC | PRN
  Administered 2015-02-04 – 2015-02-05 (×3): 500 mg via ORAL
  Filled 2015-02-04 (×4): qty 1

## 2015-02-04 MED ORDER — CEFAZOLIN SODIUM-DEXTROSE 2-3 GM-% IV SOLR
2.0000 g | Freq: Four times a day (QID) | INTRAVENOUS | Status: AC
  Administered 2015-02-04 – 2015-02-05 (×2): 2 g via INTRAVENOUS
  Filled 2015-02-04 (×2): qty 50

## 2015-02-04 MED ORDER — MIDAZOLAM HCL 2 MG/2ML IJ SOLN
INTRAMUSCULAR | Status: AC
  Filled 2015-02-04: qty 4

## 2015-02-04 MED ORDER — METOCLOPRAMIDE HCL 5 MG PO TABS: 5.0000 mg | ORAL_TABLET | Freq: Three times a day (TID) | ORAL | Status: DC | PRN

## 2015-02-04 MED ORDER — MIDAZOLAM HCL 5 MG/5ML IJ SOLN
INTRAMUSCULAR | Status: DC | PRN
  Administered 2015-02-04: 2 mg via INTRAVENOUS

## 2015-02-04 MED ORDER — FENTANYL CITRATE (PF) 250 MCG/5ML IJ SOLN
INTRAMUSCULAR | Status: AC
  Filled 2015-02-04: qty 5

## 2015-02-04 MED ORDER — LIDOCAINE HCL (CARDIAC) 20 MG/ML IV SOLN
INTRAVENOUS | Status: DC | PRN
  Administered 2015-02-04: 60 mg via INTRAVENOUS

## 2015-02-04 MED ORDER — CEFAZOLIN SODIUM-DEXTROSE 2-3 GM-% IV SOLR
INTRAVENOUS | Status: DC | PRN
  Administered 2015-02-04: 2 g via INTRAVENOUS

## 2015-02-04 MED ORDER — METHOCARBAMOL 500 MG PO TABS
ORAL_TABLET | ORAL | Status: AC
Start: 2015-02-04 — End: 2015-02-04
  Administered 2015-02-04: 500 mg via ORAL
  Filled 2015-02-04: qty 1

## 2015-02-04 MED ORDER — ASPIRIN EC 325 MG PO TBEC: 975.0000 mg | DELAYED_RELEASE_TABLET | Freq: Four times a day (QID) | ORAL | Status: DC | PRN

## 2015-02-04 MED ORDER — DIPHENHYDRAMINE HCL 12.5 MG/5ML PO ELIX
12.5000 mg | ORAL_SOLUTION | Freq: Four times a day (QID) | ORAL | Status: DC | PRN
  Administered 2015-02-04 – 2015-02-05 (×2): 25 mg via ORAL
  Filled 2015-02-04 (×3): qty 10

## 2015-02-04 MED ORDER — HYDROCODONE-ACETAMINOPHEN 5-325 MG PO TABS
1.0000 | ORAL_TABLET | Freq: Four times a day (QID) | ORAL | Status: DC | PRN
  Administered 2015-02-04 – 2015-02-05 (×3): 2 via ORAL
  Filled 2015-02-04 (×3): qty 2

## 2015-02-04 MED ORDER — FLUOXETINE HCL 20 MG PO CAPS
80.0000 mg | ORAL_CAPSULE | Freq: Every day | ORAL | Status: DC
  Administered 2015-02-04: 80 mg via ORAL
  Filled 2015-02-04: qty 4

## 2015-02-04 MED ORDER — PROPOFOL 10 MG/ML IV BOLUS
INTRAVENOUS | Status: DC | PRN
  Administered 2015-02-04: 150 mg via INTRAVENOUS

## 2015-02-04 MED ORDER — HYDROMORPHONE HCL 1 MG/ML IJ SOLN
1.0000 mg | INTRAMUSCULAR | Status: DC | PRN
  Administered 2015-02-04 – 2015-02-05 (×5): 1 mg via INTRAVENOUS
  Filled 2015-02-04 (×5): qty 1

## 2015-02-04 MED ORDER — HYDROMORPHONE HCL 1 MG/ML IJ SOLN
INTRAMUSCULAR | Status: AC
  Administered 2015-02-04: 0.5 mg via INTRAVENOUS
  Filled 2015-02-04: qty 1

## 2015-02-04 MED ORDER — ACETAMINOPHEN 650 MG RE SUPP: 650.0000 mg | Freq: Four times a day (QID) | RECTAL | Status: DC | PRN

## 2015-02-04 MED ORDER — PROMETHAZINE HCL 25 MG/ML IJ SOLN: 6.2500 mg | INTRAMUSCULAR | Status: DC | PRN

## 2015-02-04 MED ORDER — MENTHOL 3 MG MT LOZG: 1.0000 | LOZENGE | OROMUCOSAL | Status: DC | PRN

## 2015-02-04 MED ORDER — ACETAMINOPHEN 325 MG PO TABS: 650.0000 mg | ORAL_TABLET | Freq: Four times a day (QID) | ORAL | Status: DC | PRN

## 2015-02-04 MED ORDER — ASPIRIN EC 325 MG PO TBEC
325.0000 mg | DELAYED_RELEASE_TABLET | Freq: Every day | ORAL | Status: DC
  Administered 2015-02-05: 325 mg via ORAL
  Filled 2015-02-04: qty 1

## 2015-02-04 MED ORDER — HYDROMORPHONE HCL 1 MG/ML IJ SOLN
0.2500 mg | INTRAMUSCULAR | Status: DC | PRN
  Administered 2015-02-04 (×4): 0.5 mg via INTRAVENOUS

## 2015-02-04 MED ORDER — ONDANSETRON HCL 4 MG PO TABS
4.0000 mg | ORAL_TABLET | Freq: Four times a day (QID) | ORAL | Status: DC | PRN
  Filled 2015-02-04: qty 1

## 2015-02-04 MED ORDER — FENTANYL CITRATE (PF) 100 MCG/2ML IJ SOLN
INTRAMUSCULAR | Status: DC | PRN
  Administered 2015-02-04: 50 ug via INTRAVENOUS
  Administered 2015-02-04 (×2): 100 ug via INTRAVENOUS

## 2015-02-04 MED ORDER — LACTATED RINGERS IV SOLN
INTRAVENOUS | Status: DC | PRN
  Administered 2015-02-04: 18:00:00 via INTRAVENOUS

## 2015-02-04 MED ORDER — METHOCARBAMOL 1000 MG/10ML IJ SOLN
500.0000 mg | Freq: Four times a day (QID) | INTRAVENOUS | Status: DC | PRN
  Filled 2015-02-04: qty 5

## 2015-02-04 MED ORDER — HYDROCODONE-ACETAMINOPHEN 5-325 MG PO TABS
ORAL_TABLET | ORAL | Status: AC
  Administered 2015-02-04: 2 via ORAL
  Filled 2015-02-04: qty 2

## 2015-02-04 MED ORDER — SODIUM CHLORIDE 0.9 % IV SOLN: INTRAVENOUS | Status: DC

## 2015-02-04 MED ORDER — ONDANSETRON HCL 4 MG/2ML IJ SOLN
INTRAMUSCULAR | Status: DC | PRN
  Administered 2015-02-04: 4 mg via INTRAVENOUS

## 2015-02-04 MED ORDER — LACTATED RINGERS IV SOLN
INTRAVENOUS | Status: DC
  Administered 2015-02-04: 16:00:00 via INTRAVENOUS

## 2015-02-04 MED ORDER — PHENOL 1.4 % MT LIQD: 1.0000 | OROMUCOSAL | Status: DC | PRN

## 2015-02-04 MED ORDER — 0.9 % SODIUM CHLORIDE (POUR BTL) OPTIME
TOPICAL | Status: DC | PRN
  Administered 2015-02-04: 1000 mL

## 2015-02-04 MED ORDER — ONDANSETRON HCL 4 MG/2ML IJ SOLN
4.0000 mg | Freq: Four times a day (QID) | INTRAMUSCULAR | Status: DC | PRN
  Filled 2015-02-04: qty 2

## 2015-02-04 MED ORDER — METOCLOPRAMIDE HCL 5 MG/ML IJ SOLN: 5.0000 mg | Freq: Three times a day (TID) | INTRAMUSCULAR | Status: DC | PRN

## 2015-02-04 SURGICAL SUPPLY — 58 items
0.2MM DRILL BIT 145MM ×2 IMPLANT
0.2MM DRILL BIT 33MM ×2 IMPLANT
4.0x42mm Titanium Locking Screw (Screw) ×2 IMPLANT
BANDAGE ELASTIC 6 VELCRO ST LF (GAUZE/BANDAGES/DRESSINGS) ×1 IMPLANT
BIT DRILL 12 (BIT) ×2
BIT DRILL 12MM (BIT) ×1
BIT DRILL 190X12XCANN LRG QC (BIT) IMPLANT
BIT DRL 190X12XCANN LRG QC (BIT) ×1
BLADE SURG 10 STRL SS (BLADE) ×3 IMPLANT
BNDG COHESIVE 4X5 TAN STRL (GAUZE/BANDAGES/DRESSINGS) ×3 IMPLANT
BNDG COHESIVE 6X5 TAN STRL LF (GAUZE/BANDAGES/DRESSINGS) ×3 IMPLANT
BNDG GAUZE ELAST 4 BULKY (GAUZE/BANDAGES/DRESSINGS) ×5 IMPLANT
COVER MAYO STAND STRL (DRAPES) ×2 IMPLANT
COVER SURGICAL LIGHT HANDLE (MISCELLANEOUS) ×5 IMPLANT
CUFF TOURNIQUET SINGLE 34IN LL (TOURNIQUET CUFF) IMPLANT
CUFF TOURNIQUET SINGLE 44IN (TOURNIQUET CUFF) IMPLANT
DRAPE C-ARM MINI 42X72 WSTRAPS (DRAPES) ×1 IMPLANT
DRAPE IMP U-DRAPE 54X76 (DRAPES) ×3 IMPLANT
DRAPE U-SHAPE 47X51 STRL (DRAPES) ×3 IMPLANT
DRSG ADAPTIC 3X8 NADH LF (GAUZE/BANDAGES/DRESSINGS) ×3 IMPLANT
DRSG PAD ABDOMINAL 8X10 ST (GAUZE/BANDAGES/DRESSINGS) ×2 IMPLANT
ELECT REM PT RETURN 9FT ADLT (ELECTROSURGICAL) ×3
ELECTRODE REM PT RTRN 9FT ADLT (ELECTROSURGICAL) ×1 IMPLANT
GAUZE SPONGE 4X4 12PLY STRL (GAUZE/BANDAGES/DRESSINGS) ×3 IMPLANT
GLOVE BIOGEL PI IND STRL 9 (GLOVE) ×1 IMPLANT
GLOVE BIOGEL PI INDICATOR 9 (GLOVE) ×2
GLOVE SURG ORTHO 9.0 STRL STRW (GLOVE) ×3 IMPLANT
GOWN STRL REUS W/ TWL XL LVL3 (GOWN DISPOSABLE) ×3 IMPLANT
GOWN STRL REUS W/TWL XL LVL3 (GOWN DISPOSABLE) ×9
GUIDEWIRE 3.2X400 (WIRE) ×2 IMPLANT
KIT BASIN OR (CUSTOM PROCEDURE TRAY) ×3 IMPLANT
KIT ROOM TURNOVER OR (KITS) ×3 IMPLANT
NAIL TIB 9X300 (Nail) ×2 IMPLANT
NAIL TIBIAL EX 9X315MM (Nail) ×2 IMPLANT
NS IRRIG 1000ML POUR BTL (IV SOLUTION) ×3 IMPLANT
PACK ORTHO EXTREMITY (CUSTOM PROCEDURE TRAY) ×3 IMPLANT
PACK UNIVERSAL I (CUSTOM PROCEDURE TRAY) ×1 IMPLANT
PAD ARMBOARD 7.5X6 YLW CONV (MISCELLANEOUS) ×4 IMPLANT
PAD CAST 4YDX4 CTTN HI CHSV (CAST SUPPLIES) ×1 IMPLANT
PADDING CAST COTTON 4X4 STRL (CAST SUPPLIES)
PENCIL BUTTON HOLSTER BLD 10FT (ELECTRODE) ×3 IMPLANT
REAMER ROD DEEP FLUTE 2.5X950 (INSTRUMENTS) ×2 IMPLANT
SCREW DUAL CORE 5X50 (Screw) ×2 IMPLANT
SCREW LOCKING 4.0 40MM (Screw) ×2 IMPLANT
SPONGE LAP 18X18 X RAY DECT (DISPOSABLE) ×3 IMPLANT
STAPLER VISISTAT 35W (STAPLE) ×3 IMPLANT
STOCKINETTE IMPERVIOUS LG (DRAPES) ×1 IMPLANT
SUCTION FRAZIER TIP 10 FR DISP (SUCTIONS) ×3 IMPLANT
SUT ETHILON 2 0 PSLX (SUTURE) ×2 IMPLANT
SUT VIC AB 0 CTB1 27 (SUTURE) ×3 IMPLANT
SUT VIC AB 2-0 CTB1 (SUTURE) ×3 IMPLANT
SYR BULB IRRIGATION 50ML (SYRINGE) ×3 IMPLANT
TOWEL OR 17X24 6PK STRL BLUE (TOWEL DISPOSABLE) ×3 IMPLANT
TOWEL OR 17X26 10 PK STRL BLUE (TOWEL DISPOSABLE) ×3 IMPLANT
TUBE CONNECTING 12'X1/4 (SUCTIONS) ×1
TUBE CONNECTING 12X1/4 (SUCTIONS) ×2 IMPLANT
WATER STERILE IRR 1000ML POUR (IV SOLUTION) ×1 IMPLANT
YANKAUER SUCT BULB TIP NO VENT (SUCTIONS) ×2 IMPLANT

## 2015-02-04 NOTE — Transfer of Care (Signed)
Immediate Anesthesia Transfer of Care Note  Patient: Gina Fuentes  Procedure(s) Performed: Procedure(s): INTRAMEDULLARY (IM) NAIL TIBIAL (Left)  Patient Location: PACU  Anesthesia Type:General  Level of Consciousness: awake, alert , oriented and patient cooperative  Airway & Oxygen Therapy: Patient Spontanous Breathing and Patient connected to nasal cannula oxygen  Post-op Assessment: Report given to RN, Post -op Vital signs reviewed and stable, Patient moving all extremities, Patient moving all extremities X 4 and Patient able to stick tongue midline  Post vital signs: Reviewed and stable  Last Vitals:  Filed Vitals:   02/04/15 1611  BP: 158/72  Pulse: 76  Temp: 36.9 C  Resp: 20    Complications: No apparent anesthesia complications

## 2015-02-04 NOTE — Anesthesia Preprocedure Evaluation (Addendum)
Anesthesia Evaluation  Patient identified by MRN, date of birth, ID band Patient awake    Reviewed: Allergy & Precautions, NPO status , Patient's Chart, lab work & pertinent test results  Airway Mallampati: II  TM Distance: >3 FB Neck ROM: Full    Dental no notable dental hx. (+) Teeth Intact, Dental Advisory Given   Pulmonary Current Smoker,    Pulmonary exam normal breath sounds clear to auscultation       Cardiovascular negative cardio ROS Normal cardiovascular exam Rhythm:Regular Rate:Normal     Neuro/Psych negative neurological ROS  negative psych ROS   GI/Hepatic negative GI ROS, Neg liver ROS,   Endo/Other  negative endocrine ROS  Renal/GU negative Renal ROS  negative genitourinary   Musculoskeletal negative musculoskeletal ROS (+)   Abdominal   Peds negative pediatric ROS (+)  Hematology negative hematology ROS (+)   Anesthesia Other Findings   Reproductive/Obstetrics negative OB ROS                           Anesthesia Physical Anesthesia Plan  ASA: II  Anesthesia Plan: General   Post-op Pain Management:    Induction: Intravenous  Airway Management Planned: Oral ETT  Additional Equipment:   Intra-op Plan:   Post-operative Plan: Extubation in OR  Informed Consent: I have reviewed the patients History and Physical, chart, labs and discussed the procedure including the risks, benefits and alternatives for the proposed anesthesia with the patient or authorized representative who has indicated his/her understanding and acceptance.   Dental advisory given  Plan Discussed with: CRNA, Surgeon and Anesthesiologist  Anesthesia Plan Comments:        Anesthesia Quick Evaluation

## 2015-02-04 NOTE — Op Note (Signed)
02/04/2015  7:01 PM  PATIENT:  Gina Fuentes    PRE-OPERATIVE DIAGNOSIS:  Left Tibia-Fibula Fracture  POST-OPERATIVE DIAGNOSIS:  Left tib-fib fracture with a large posterior malleolar fracture  PROCEDURE: #1 INTRAMEDULLARY (IM) NAIL LEFT TIBIAL #2 internal fixation large posterior malleolar fracture left ankle C-arm fluoroscopy utilized for guidance   SURGEON:  DUDA,MARCUS V, MD  PHYSICIAN ASSISTANT:None ANESTHESIA:   General  PREOPERATIVE INDICATIONS:  Gina Fuentes is a  43 y.o. female with a diagnosis of Left Tibia-Fibula Fracture who failed conservative measures and elected for surgical management.    The risks benefits and alternatives were discussed with the patient preoperatively including but not limited to the risks of infection, bleeding, nerve injury, cardiopulmonary complications, the need for revision surgery, among others, and the patient was willing to proceed.  OPERATIVE IMPLANTS: 315 x 9 mm Synthes tibial nail  OPERATIVE FINDINGS: Patient had a large nondisplaced posterior malleolar fracture which was not evident on the initial radiographs however this was identified during surgery.  OPERATIVE PROCEDURE: Patient was brought to the operating room and underwent a general and aesthetic. After adequate levels anesthesia were obtained patient was placed supine on the operating table and the left lower extremity was prepped using DuraPrep draped into a sterile field. A timeout was called. An incision was made along the medial aspect the patella from the midshaft of the patella extending proximally about 3 cm. A guidewire was then inserted center center into the anterior aspect of the tibia the guidewire was inserted down the shaft of the tibia and this was then overreamed a guidewire was then inserted the fracture was reduced and the tibia was sequentially reamed to 10.5 mm for the 9 mm nail. The initial nail chosen was 300 mm. However when this nail was placed the non-does  placed posterior malleolar fracture was identified and the nail was too short to capture the posterior malleolar fragment. This nail was removed a 315 mm nail was then inserted and this had the proper length to capture the posterior malleolar fragment. The nail was locked proximally with a 50 mm nail and distally using freehand technique a medial to lateral screw was placed to stabilize the nail and then the C-arm was changed and oblique anterior to posterior screw was placed to capture the posterior malleolar fragment. C-arm fluoroscopy verified complete reduction of the posterior malleolar fragment. The wounds were irrigated with normal saline and the incisions were closed using Vicryls and staples. A sterile compressive dressing was applied patient was extubated taken to the PACU in stable condition.

## 2015-02-04 NOTE — Anesthesia Postprocedure Evaluation (Signed)
  Anesthesia Post-op Note  Patient: Gina Fuentes  Procedure(s) Performed: Procedure(s): INTRAMEDULLARY (IM) NAIL TIBIAL (Left)  Patient Location: PACU  Anesthesia Type:General  Level of Consciousness: awake and alert   Airway and Oxygen Therapy: Patient Spontanous Breathing  Post-op Pain: Controlled  Post-op Assessment: Post-op Vital signs reviewed, Patient's Cardiovascular Status Stable and Respiratory Function Stable  Post-op Vital Signs: Reviewed  Filed Vitals:   02/04/15 2044  BP: 144/64  Pulse: 98  Temp: 36.8 C  Resp: 15    Complications: No apparent anesthesia complications

## 2015-02-04 NOTE — H&P (Signed)
Gina Fuentes is an 44 y.o. female.   Chief Complaint: Left closed tib-fib fracture HPI: Patient is a 44 year old woman fell from a horse last night she was seen in the emergency room the report was she had an ankle fracture she was splinted she was seen in my office today and this showed a diaphyseal tibia and fibular fracture. Patient was scheduled for tibial nail internal fixation.  History reviewed. No pertinent past medical history.  Past Surgical History  Procedure Laterality Date  . Breast surgery    . Tonsillectomy    . Mouth surgery      History reviewed. No pertinent family history. Social History:  reports that she has been smoking.  She does not have any smokeless tobacco history on file. She reports that she drinks about 1.2 oz of alcohol per week. She reports that she does not use illicit drugs.  Allergies:  Allergies  Allergen Reactions  . Penicillins Other (See Comments)    Childhood allergic reaction - pt was never told the reaction and does not recall whether or not she was hospitalized.    Medications Prior to Admission  Medication Sig Dispense Refill  . aspirin EC 325 MG tablet Take 975 mg by mouth every 6 (six) hours as needed (pain).    . Aspirin-Acetaminophen-Caffeine (GOODY HEADACHE PO) Take 2 packets by mouth 2 (two) times daily as needed (pain).    Marland Kitchen FLUoxetine (PROZAC) 20 MG capsule Take 80 mg by mouth at bedtime.    Marland Kitchen levonorgestrel (MIRENA) 20 MCG/24HR IUD 1 each by Intrauterine route once. Implanted June 2016    . oxyCODONE (ROXICODONE) 5 MG immediate release tablet Take 1-2 tablets (5-10 mg total) by mouth every 4 (four) hours as needed for severe pain. 20 tablet 0    Results for orders placed or performed during the hospital encounter of 02/03/15 (from the past 48 hour(s))  CBC with Differential     Status: Abnormal   Collection Time: 02/03/15  6:38 PM  Result Value Ref Range   WBC 10.6 (H) 4.0 - 10.5 K/uL   RBC 3.90 3.87 - 5.11 MIL/uL   Hemoglobin 13.4 12.0 - 15.0 g/dL   HCT 39.4 36.0 - 46.0 %   MCV 101.0 (H) 78.0 - 100.0 fL   MCH 34.4 (H) 26.0 - 34.0 pg   MCHC 34.0 30.0 - 36.0 g/dL   RDW 13.3 11.5 - 15.5 %   Platelets 312 150 - 400 K/uL   Neutrophils Relative % 63 %   Neutro Abs 6.6 1.7 - 7.7 K/uL   Lymphocytes Relative 27 %   Lymphs Abs 2.9 0.7 - 4.0 K/uL   Monocytes Relative 9 %   Monocytes Absolute 0.9 0.1 - 1.0 K/uL   Eosinophils Relative 1 %   Eosinophils Absolute 0.1 0.0 - 0.7 K/uL   Basophils Relative 0 %   Basophils Absolute 0.0 0.0 - 0.1 K/uL  Basic metabolic panel     Status: Abnormal   Collection Time: 02/03/15  6:38 PM  Result Value Ref Range   Sodium 136 135 - 145 mmol/L   Potassium 5.0 3.5 - 5.1 mmol/L   Chloride 102 101 - 111 mmol/L   CO2 22 22 - 32 mmol/L   Glucose, Bld 102 (H) 65 - 99 mg/dL   BUN <5 (L) 6 - 20 mg/dL   Creatinine, Ser 0.53 0.44 - 1.00 mg/dL   Calcium 9.0 8.9 - 10.3 mg/dL   GFR calc non Af Amer >60 >60 mL/min  GFR calc Af Amer >60 >60 mL/min    Comment: (NOTE) The eGFR has been calculated using the CKD EPI equation. This calculation has not been validated in all clinical situations. eGFR's persistently <60 mL/min signify possible Chronic Kidney Disease.    Anion gap 12 5 - 15   Dg Tibia/fibula Left  02/03/2015   CLINICAL DATA:  [place] from horse.  EXAM: LEFT TIBIA AND FIBULA - 2 VIEW  COMPARISON:  None.  FINDINGS: There are distal diaphyseal fractures of the tibia and fibula approximately 8-9 cm above the tibiotalar joint line. The tibial fracture is comminuted with a posterior butterfly fragment. There is 1/2 shaft width lateral displacement at the tibial fracture with mild apex-anterior angulation. The fibular fracture is displaced a full shaft width laterally. There also is a coronally oriented intra-articular distal tibial fracture which is nearly nondisplaced. There is an oblique proximal fibular fracture which is displaced a little more than 1 cortex width anterolaterally.   IMPRESSION: 1. Fractures of the distal tibial and distal fibular diaphyses 8-9 cm above the ankle joint. 2. Coronally oriented intra-articular fracture at the distal tibia posteriorly, nondisplaced. 3. Proximal fibular fracture.   Electronically Signed   By: Andreas Newport M.D.   On: 02/03/2015 19:28   Dg Knee Complete 4 Views Left  02/03/2015   CLINICAL DATA:  Fall from horse. Left leg deformity. Initial encounter.  EXAM: LEFT KNEE - COMPLETE 4+ VIEW  COMPARISON:  None.  FINDINGS: There is an acute, mildly comminuted and mildly displaced oblique fracture of the fibular neck. This demonstrates no intra-articular extension. The proximal tibia, distal femur and patella appear intact. No significant joint effusion.  IMPRESSION: Mildly displaced oblique fracture of the left fibular neck.   Electronically Signed   By: Richardean Sale M.D.   On: 02/03/2015 19:23   Dg Foot Complete Left  02/03/2015   CLINICAL DATA:  44 year old who fell off of a horse earlier today and sustained injuries to the left lower leg. Initial encounter.  EXAM: LEFT FOOT - COMPLETE 2 VIEW  COMPARISON:  Left tibia=fibula x-rays obtained concurrently.  FINDINGS: No visible fractures involving the bones of the foot. Joint spaces well preserved. Distal fibular fracture will be detailed on the report of the tibia-fibula x-rays.  IMPRESSION: No visible fractures involving the bones of the foot.   Electronically Signed   By: Evangeline Dakin M.D.   On: 02/03/2015 19:24    Review of Systems  All other systems reviewed and are negative.   Blood pressure 158/72, pulse 76, temperature 98.4 F (36.9 C), temperature source Oral, resp. rate 20, height $RemoveBe'5\' 2"'UUiEDeaca$  (1.575 m), weight 53.524 kg (118 lb), SpO2 100 %. Physical Exam  On examination patient's left foot is neurovascularly intact she can move her toes well. Radiographs shows a displaced diaphyseal fracture of the left tibia and fibula. Assessment/Plan Assessment: Closed left diaphyseal  tibia and fibular fracture.  Plan: We'll plan for intramedullary nail fixation. Risks and benefits were discussed including infection neurovascular injury nonhealing the bone nonhealing of the skin need for additional surgery. Patient states she understands wish to proceed at this time. Again the importance of smoking cessation was discussed the risk of the bone not healing due to smoking was reviewed. Patient states she understands states that she will stop smoking.  Elexis Pollak V 02/04/2015, 6:08 PM

## 2015-02-04 NOTE — Anesthesia Procedure Notes (Signed)
Procedure Name: Intubation Date/Time: 02/04/2015 6:11 PM Performed by: Izora Gala Pre-anesthesia Checklist: Patient identified, Emergency Drugs available, Suction available and Patient being monitored Patient Re-evaluated:Patient Re-evaluated prior to inductionOxygen Delivery Method: Circle system utilized Preoxygenation: Pre-oxygenation with 100% oxygen Intubation Type: IV induction Ventilation: Mask ventilation without difficulty Laryngoscope Size: Mac and 3 Grade View: Grade I Tube type: Oral Tube size: 7.0 mm Number of attempts: 1 Airway Equipment and Method: Stylet and LTA kit utilized Placement Confirmation: ETT inserted through vocal cords under direct vision,  positive ETCO2 and breath sounds checked- equal and bilateral Secured at: 21 cm Tube secured with: Tape Dental Injury: Teeth and Oropharynx as per pre-operative assessment

## 2015-02-05 ENCOUNTER — Encounter (HOSPITAL_COMMUNITY): Payer: Self-pay | Admitting: Orthopedic Surgery

## 2015-02-05 DIAGNOSIS — S82402A Unspecified fracture of shaft of left fibula, initial encounter for closed fracture: Secondary | ICD-10-CM | POA: Diagnosis not present

## 2015-02-05 MED ORDER — ASPIRIN EC 325 MG PO TBEC: 325.0000 mg | DELAYED_RELEASE_TABLET | Freq: Every day | ORAL | Status: DC

## 2015-02-05 MED ORDER — HYDROCODONE-ACETAMINOPHEN 5-325 MG PO TABS: 1.0000 | ORAL_TABLET | Freq: Four times a day (QID) | ORAL | Status: DC | PRN

## 2015-02-05 MED ORDER — INFLUENZA VAC SPLIT QUAD 0.5 ML IM SUSY
0.5000 mL | PREFILLED_SYRINGE | INTRAMUSCULAR | Status: DC
Start: 2015-02-06 — End: 2015-02-05

## 2015-02-05 MED ORDER — PNEUMOCOCCAL VAC POLYVALENT 25 MCG/0.5ML IJ INJ: 0.5000 mL | INJECTION | INTRAMUSCULAR | Status: DC

## 2015-02-05 NOTE — Progress Notes (Signed)
Orthopedic Tech Progress Note Patient Details:  Gina Fuentes October 10, 1970 [id number] CAM walker delivered to room. Patient stated she wanted to wait until Dr. Lajoyce Corners came in this morning to see her before applying boot. Complains of itching under dressing and wants to know if leg can "breathe" for awhile. PT or Ortho tech will apply cam walker later this morning.  Ortho Devices Type of Ortho Device: CAM walker Ortho Device/Splint Interventions: Ordered   [place] R Thompson 02/05/2015, 6:32 AM

## 2015-02-05 NOTE — Discharge Summary (Signed)
Physician Discharge Summary  Patient ID: Gina Fuentes MRN: [id number] DOB/AGE: 11-30-1970 44 y.o.  Admit date: 02/04/2015 Discharge date: 02/05/2015  Admission Diagnoses: Midshaft left tibia and fibular fracture with posterior malleolar fracture left ankle  Discharge Diagnoses: Same Active Problems:   Fracture of tibia and fibula, shaft   Discharged Condition: stable  Hospital Course: Patient's hospital course was essentially unremarkable. She underwent intramedullary nailing of the tibial shaft fracture and underwent internal fixation for the posterior malleolar fracture of the left ankle. Postoperatively patient progressed well with physical therapy and was discharged to home.  Consults: None  Significant Diagnostic Studies: labs: Routine labs  Treatments: surgery: See operative note  Discharge Exam: Blood pressure 140/79, pulse 72, temperature 98.4 F (36.9 C), temperature source Oral, resp. rate 16, height  (1.575 m), weight 53.524 kg (118 lb), SpO2 95 %. Incision/Wound: dressing clean and dry  Disposition: 01-Home or Self Care  Discharge Instructions    Call MD / Call 911    Complete by:  As directed   If you experience chest pain or shortness of breath, CALL 911 and be transported to the hospital emergency room.  If you develope a fever above 101 F, pus (white drainage) or increased drainage or redness at the wound, or calf pain, call your surgeon's office.     Constipation Prevention    Complete by:  As directed   Drink plenty of fluids.  Prune juice may be helpful.  You may use a stool softener, such as Colace (over the counter) 100 mg twice a day.  Use MiraLax (over the counter) for constipation as needed.     Diet - low sodium heart healthy    Complete by:  As directed      Elevate operative extremity    Complete by:  As directed      Increase activity slowly as tolerated    Complete by:  As directed      Non weight bearing    Complete by:  As directed   Laterality:  left  Extremity:  Lower            Medication List    TAKE these medications        aspirin EC 325 MG tablet  Take 975 mg by mouth every 6 (six) hours as needed (pain).     aspirin EC 325 MG tablet  Take 1 tablet (325 mg total) by mouth daily.     FLUoxetine 20 MG capsule  Commonly known as:  PROZAC  Take 80 mg by mouth at bedtime.     GOODY HEADACHE PO  Take 2 packets by mouth 2 (two) times daily as needed (pain).     HYDROcodone-acetaminophen 5-325 MG tablet  Commonly known as:  NORCO  Take 1-2 tablets by mouth every 6 (six) hours as needed.     levonorgestrel 20 MCG/24HR IUD  Commonly known as:  MIRENA  1 each by Intrauterine route once. Implanted June 2016     oxyCODONE 5 MG immediate release tablet  Commonly known as:  ROXICODONE  Take 1-2 tablets (5-10 mg total) by mouth every 4 (four) hours as needed for severe pain.           Follow-up Information    Follow up with Bashar Milam V, MD In 2 weeks.   Specialty:  Orthopedic Surgery   Contact information:   [address] [place] [address] [phone]       Signed: Nadara Mustard 02/05/2015, 7:03  AM

## 2015-02-05 NOTE — Evaluation (Addendum)
Physical Therapy Evaluation Patient Details Name: Gina Fuentes MRN: [id number] DOB: May 02, 1971 Today's Date: 02/05/2015   History of Present Illness  Midshaft left tibia and fibular fracture with posterior malleolar fracture left ankle, now s/p intramedullary nailing of the tibial shaft fracture and underwent internal fixation for the posterior malleolar fracture of the left ankle  Clinical Impression  Patient is s/p above surgery resulting in functional limitations due to the deficits listed below (see PT Problem List). Patient will benefit from skilled PT to increase their independence and safety with mobility to allow discharge to home with assistance. Following session, patient reports feeling confident with going home and denies any questions or concerns. Recommended having someone around to supervise and assist with mobility as needed.        Follow Up Recommendations Home health PT;Supervision for mobility/OOB    Equipment Recommendations  None recommended by PT;Other (comment) (patient reports having all equipment needs already met)    Recommendations for Other Services       Precautions / Restrictions Precautions Precautions: Fall Required Braces or Orthoses: Other Brace/Splint (Cam walker boot) Restrictions Weight Bearing Restrictions: Yes LLE Weight Bearing: Non weight bearing      Mobility  Bed Mobility Overal bed mobility: Modified Independent             General bed mobility comments: supine to sit, no rail  Transfers Overall transfer level: Modified independent Equipment used: Rolling walker (2 wheeled)             General transfer comment: reminder to use at least one hand from stable surface when sitting/standing.  Ambulation/Gait Ambulation/Gait assistance: Supervision Ambulation Distance (Feet): 75 Feet Assistive device: Rolling walker (2 wheeled) Gait Pattern/deviations:  (swing through) Gait velocity: decreased   General Gait Details:  consistent with NWB status  Stairs Stairs: Yes Stairs assistance: Min guard Stair Management: No rails;Forwards;Step to pattern;With crutches Number of Stairs: 2 General stair comments: patient reports feeling confident with mobility including stairs at completion of session. Recommended having someone with her when attempting steps for additional assist with stability.   Wheelchair Mobility    Modified Rankin (Stroke Patients Only)       Balance Overall balance assessment: Needs assistance Sitting-balance support: No upper extremity supported Sitting balance-Leahy Scale: Normal     Standing balance support: Single extremity supported Standing balance-Leahy Scale: Poor Standing balance comment: using rw for balance                             Pertinent Vitals/Pain Pain Assessment: 0-10 Pain Score: 7  Pain Location: Lt leg Pain Descriptors / Indicators: Aching Pain Intervention(s): Monitored during session;Limited activity within patient's tolerance    Home Living Family/patient expects to be discharged to:: Private residence Living Arrangements: Spouse/significant other Available Help at Discharge: Family;Friend(s) Type of Home: House Home Access: Stairs to enter Entrance Stairs-Rails: None Entrance Stairs-Number of Steps: 2 Home Layout: One level Home Equipment: Environmental consultant - 2 wheels;Crutches      Prior Function Level of Independence: Independent               Hand Dominance        Extremity/Trunk Assessment               Lower Extremity Assessment: LLE deficits/detail   LLE Deficits / Details: able to do SLR with lag     Communication   Communication: No difficulties  Cognition Arousal/Alertness: Awake/alert Behavior During Therapy:  WFL for tasks assessed/performed Overall Cognitive Status: Within Functional Limits for tasks assessed                      General Comments General comments (skin integrity, edema, etc.):  mild bloody drainage noted after session form distal dressing. No drainage once sitting and LE elevated.     Exercises        Assessment/Plan    PT Assessment Patient needs continued PT services  PT Diagnosis Difficulty walking;Acute pain   PT Problem List Decreased strength;Decreased range of motion;Decreased activity tolerance;Decreased balance;Decreased mobility;Decreased knowledge of use of DME;Pain  PT Treatment Interventions DME instruction;Gait training;Stair training;Functional mobility training;Therapeutic activities;Therapeutic exercise;Balance training;Patient/family education   PT Goals (Current goals can be found in the Care Plan section) Acute Rehab PT Goals Patient Stated Goal: go home today PT Goal Formulation: With patient Time For Goal Achievement: 02/19/15 Potential to Achieve Goals: Good    Frequency Min 3X/week   Barriers to discharge        Co-evaluation               End of Session Equipment Utilized During Treatment: Gait belt;Other (comment) (cam boot) Activity Tolerance: Patient tolerated treatment well Patient left: in chair;with call bell/phone within reach;with family/visitor present;Other (comment) (LE elevated) Nurse Communication: Mobility status    Functional Assessment Tool Used: clinical judgment Functional Limitation: Mobility: Walking and moving around Mobility: Walking and Moving Around Current Status (W0981): At least 20 percent but less than 40 percent impaired, limited or restricted Mobility: Walking and Moving Around Goal Status [phone]): At least 1 percent but less than 20 percent impaired, limited or restricted    Time: 0912-0948 PT Time Calculation (min) (ACUTE ONLY): 36 min   Charges:   PT Evaluation $Initial PT Evaluation Tier I: 1 Procedure PT Treatments $Gait Training: 8-22 mins   PT G Codes:   PT G-Codes **NOT FOR INPATIENT CLASS** Functional Assessment Tool Used: clinical judgment Functional Limitation:  Mobility: Walking and moving around Mobility: Walking and Moving Around Current Status (W2956): At least 20 percent but less than 40 percent impaired, limited or restricted Mobility: Walking and Moving Around Goal Status [phone]): At least 1 percent but less than 20 percent impaired, limited or restricted    Cassell Clement, PT, CSCS Pager [phone] Office [phone]  02/05/2015, 11:05 AM

## 2015-02-05 NOTE — Progress Notes (Signed)
Gina Fuentes discharged home per MD order. Discharge instructions reviewed and discussed with patient. All questions and concerns answered. Copy of instructions and scripts given to patient. IV removed.  Patient escorted to car by staff in a wheelchair. No distress noted upon discharge.   Dennard Nip R 02/05/2015 10:53 AM

## 2016-05-17 ENCOUNTER — Other Ambulatory Visit: Payer: Self-pay | Admitting: Family Medicine

## 2016-05-17 ENCOUNTER — Ambulatory Visit
Admission: RE | Admit: 2016-05-17 | Discharge: 2016-05-17 | Disposition: A | Discharge status: Home / Self Care | Payer: Medicaid Other | Source: Ambulatory Visit | Attending: Family Medicine | Admitting: Family Medicine

## 2016-05-17 DIAGNOSIS — M7581 Other shoulder lesions, right shoulder: Secondary | ICD-10-CM

## 2017-02-01 ENCOUNTER — Ambulatory Visit
Admission: RE | Admit: 2017-02-01 | Discharge: 2017-02-01 | Disposition: A | Discharge status: Home / Self Care | Payer: Medicaid Other | Source: Ambulatory Visit | Attending: Family Medicine | Admitting: Family Medicine

## 2017-02-01 ENCOUNTER — Other Ambulatory Visit: Payer: Self-pay | Admitting: Family Medicine

## 2017-02-01 DIAGNOSIS — M79674 Pain in right toe(s): Secondary | ICD-10-CM

## 2017-02-24 ENCOUNTER — Ambulatory Visit: Payer: Self-pay | Admitting: Podiatry

## 2017-03-15 ENCOUNTER — Ambulatory Visit: Payer: Medicaid Other | Admitting: Podiatry

## 2017-03-15 ENCOUNTER — Encounter: Payer: Self-pay | Admitting: Podiatry

## 2017-03-15 VITALS — BP 159/99 | HR 68 | Ht 61.0 in | Wt 125.0 lb

## 2017-03-15 DIAGNOSIS — M779 Enthesopathy, unspecified: Secondary | ICD-10-CM

## 2017-03-15 DIAGNOSIS — M778 Other enthesopathies, not elsewhere classified: Secondary | ICD-10-CM

## 2017-03-15 DIAGNOSIS — M7751 Other enthesopathy of right foot: Secondary | ICD-10-CM | POA: Diagnosis not present

## 2017-03-15 DIAGNOSIS — M205X1 Other deformities of toe(s) (acquired), right foot: Secondary | ICD-10-CM

## 2017-03-15 NOTE — Progress Notes (Signed)
   Subjective:    Patient ID: Gina Fuentes, female    DOB: 09/06/1970, [age] y.o.   MRN: [id number]  HPI  Chief Complaint  Patient presents with  . Bunions    bilateral, more painful on the right. [facility] about a [facility] ago      Review of Systems  All other systems reviewed and are negative.      Objective:   Physical Exam: Vital signs are stable she is alert and oriented 3. Pulses are palpable. No apparent distress. Pulses are strongly palpable neurologic sensorium is intact deep tendon reflexes are intact muscle strength is normal. Orthopedic evaluation demonstrates limited range of motion and enlargement of the first metatarsophalangeal joint of the right foot. Tender on range of motion and palpation. Radiographs from different imaging site was reviewed today demonstrating joint space narrowing subchondral sclerosis and spurring.        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  Plan: Injected the area today with Kenalog and local anesthetic 30-gauge needle.

## 2017-03-16 ENCOUNTER — Telehealth: Payer: Self-pay | Admitting: Podiatry

## 2017-03-16 NOTE — Telephone Encounter (Signed)
I saw Dr. Al CorpusHyatt yesterday and he gave me an injection in my right great toe. I was wondering how long it takes for that to kick in because my toe still feels like it did before I came in. If somebody would please call me back at [phone]. Thank you.

## 2017-03-17 NOTE — Telephone Encounter (Signed)
Left message informing pt of Dr. Geryl RankinsHyatt's statement that it usually took 3-5 days before injection effects were noticed.

## 2017-03-17 NOTE — Telephone Encounter (Signed)
Usually three to five days. 

## 2017-06-13 ENCOUNTER — Telehealth: Payer: Self-pay | Admitting: Podiatry

## 2017-06-13 NOTE — Telephone Encounter (Signed)
Hello, this is Tammy in referrals at [facility]. I just spoke with Marylene Landngela and she told me this pt was seen on 15 March 2017 with Dr. Al CorpusHyatt. We need to those notes faxed to [phone] and if you have any questions you can call me back at [phone]. Thank you.

## 2018-10-16 ENCOUNTER — Other Ambulatory Visit (HOSPITAL_COMMUNITY): Payer: Self-pay | Admitting: Orthopedic Surgery

## 2018-11-08 ENCOUNTER — Other Ambulatory Visit: Payer: Self-pay

## 2018-11-08 ENCOUNTER — Encounter (HOSPITAL_BASED_OUTPATIENT_CLINIC_OR_DEPARTMENT_OTHER): Payer: Self-pay | Admitting: *Deleted

## 2018-11-13 ENCOUNTER — Other Ambulatory Visit: Payer: Self-pay

## 2018-11-13 ENCOUNTER — Encounter (HOSPITAL_BASED_OUTPATIENT_CLINIC_OR_DEPARTMENT_OTHER)
Admission: RE | Admit: 2018-11-13 | Discharge: 2018-11-13 | Disposition: A | Discharge status: Home / Self Care | Payer: Medicaid Other | Source: Ambulatory Visit | Attending: Orthopedic Surgery | Admitting: Orthopedic Surgery

## 2018-11-13 ENCOUNTER — Other Ambulatory Visit (HOSPITAL_COMMUNITY)
Admission: RE | Admit: 2018-11-13 | Discharge: 2018-11-13 | Disposition: A | Discharge status: Home / Self Care | Payer: Medicaid Other | Source: Ambulatory Visit | Attending: Orthopedic Surgery | Admitting: Orthopedic Surgery

## 2018-11-13 DIAGNOSIS — Z01818 Encounter for other preprocedural examination: Secondary | ICD-10-CM | POA: Diagnosis present

## 2018-11-13 DIAGNOSIS — M2021 Hallux rigidus, right foot: Secondary | ICD-10-CM | POA: Insufficient documentation

## 2018-11-13 DIAGNOSIS — Z1159 Encounter for screening for other viral diseases: Secondary | ICD-10-CM | POA: Insufficient documentation

## 2018-11-13 LAB — BASIC METABOLIC PANEL
Anion gap: 14 (ref 5–15)
BUN: 10 mg/dL (ref 6–20)
CO2: 21 mmol/L — ABNORMAL LOW (ref 22–32)
Calcium: 10 mg/dL (ref 8.9–10.3)
Chloride: 102 mmol/L (ref 98–111)
Creatinine, Ser: 0.79 mg/dL (ref 0.44–1.00)
GFR calc Af Amer: >60 mL/min (ref >60)
GFR calc non Af Amer: >60 mL/min (ref >60)
Glucose, Bld: 93 mg/dL (ref 70–99)
Potassium: 4.4 mmol/L (ref 3.5–5.1)
Sodium: 137 mmol/L (ref 135–145)

## 2018-11-13 LAB — SARS CORONAVIRUS 2 (TAT 6-24 HRS): SARS Coronavirus 2: NEGATIVE

## 2018-11-13 NOTE — Progress Notes (Signed)
Ensure pre surgery drink given with instructions, pt verbalized understanding. 

## 2018-11-15 NOTE — Anesthesia Preprocedure Evaluation (Addendum)
Anesthesia Evaluation  Patient identified by MRN, date of birth, ID band Patient awake    Reviewed: Allergy & Precautions, NPO status , Patient's Chart, lab work & pertinent test results  Airway Mallampati: II  TM Distance: >3 FB Neck ROM: Full    Dental  (+) Teeth Intact, Dental Advisory Given Upper permanent bridge :   Pulmonary Current Smoker,    Pulmonary exam normal breath sounds clear to auscultation       Cardiovascular hypertension, Pt. on medications Normal cardiovascular exam Rhythm:Regular Rate:Normal     Neuro/Psych PSYCHIATRIC DISORDERS Depression negative neurological ROS     GI/Hepatic negative GI ROS, Neg liver ROS,   Endo/Other  negative endocrine ROS  Renal/GU negative Renal ROS     Musculoskeletal right foot hallux rigidus   Abdominal   Peds  Hematology negative hematology ROS (+)   Anesthesia Other Findings Day of surgery medications reviewed with the patient.  Reproductive/Obstetrics                            Anesthesia Physical Anesthesia Plan  ASA: II  Anesthesia Plan: General   Post-op Pain Management:  Regional for Post-op pain   Induction: Intravenous  PONV Risk Score and Plan: 2 and Dexamethasone, Ondansetron and Midazolam  Airway Management Planned: LMA  Additional Equipment:   Intra-op Plan:   Post-operative Plan: Extubation in OR  Informed Consent: I have reviewed the patients History and Physical, chart, labs and discussed the procedure including the risks, benefits and alternatives for the proposed anesthesia with the patient or authorized representative who has indicated his/her understanding and acceptance.     Dental advisory given  Plan Discussed with: CRNA  Anesthesia Plan Comments:        Anesthesia Quick Evaluation

## 2018-11-16 ENCOUNTER — Other Ambulatory Visit: Payer: Self-pay

## 2018-11-16 ENCOUNTER — Ambulatory Visit (HOSPITAL_BASED_OUTPATIENT_CLINIC_OR_DEPARTMENT_OTHER): Payer: Medicaid Other | Admitting: Anesthesiology

## 2018-11-16 ENCOUNTER — Encounter (HOSPITAL_BASED_OUTPATIENT_CLINIC_OR_DEPARTMENT_OTHER): Payer: Self-pay | Admitting: *Deleted

## 2018-11-16 ENCOUNTER — Ambulatory Visit (HOSPITAL_BASED_OUTPATIENT_CLINIC_OR_DEPARTMENT_OTHER)
Admission: RE | Admit: 2018-11-16 | Discharge: 2018-11-16 | Disposition: A | Discharge status: Home / Self Care | Payer: Medicaid Other | Attending: Orthopedic Surgery | Admitting: Orthopedic Surgery

## 2018-11-16 ENCOUNTER — Encounter (HOSPITAL_BASED_OUTPATIENT_CLINIC_OR_DEPARTMENT_OTHER)
Admission: RE | Disposition: A | Discharge status: Home / Self Care | Payer: Self-pay | Source: Home / Self Care | Attending: Orthopedic Surgery

## 2018-11-16 DIAGNOSIS — I10 Essential (primary) hypertension: Secondary | ICD-10-CM | POA: Insufficient documentation

## 2018-11-16 DIAGNOSIS — Z79899 Other long term (current) drug therapy: Secondary | ICD-10-CM | POA: Diagnosis not present

## 2018-11-16 DIAGNOSIS — F329 Major depressive disorder, single episode, unspecified: Secondary | ICD-10-CM | POA: Diagnosis not present

## 2018-11-16 DIAGNOSIS — R2241 Localized swelling, mass and lump, right lower limb: Secondary | ICD-10-CM | POA: Insufficient documentation

## 2018-11-16 DIAGNOSIS — Z7982 Long term (current) use of aspirin: Secondary | ICD-10-CM | POA: Diagnosis not present

## 2018-11-16 DIAGNOSIS — M2021 Hallux rigidus, right foot: Secondary | ICD-10-CM | POA: Insufficient documentation

## 2018-11-16 DIAGNOSIS — F17210 Nicotine dependence, cigarettes, uncomplicated: Secondary | ICD-10-CM | POA: Diagnosis not present

## 2018-11-16 DIAGNOSIS — M67471 Ganglion, right ankle and foot: Secondary | ICD-10-CM | POA: Insufficient documentation

## 2018-11-16 HISTORY — PX: ARTHRODESIS METATARSALPHALANGEAL JOINT (MTPJ): SHX6566

## 2018-11-16 HISTORY — DX: Depression, unspecified: F32.A

## 2018-11-16 HISTORY — DX: Essential (primary) hypertension: I10

## 2018-11-16 LAB — POCT PREGNANCY, URINE: Preg Test, Ur: NEGATIVE

## 2018-11-16 SURGERY — FUSION, JOINT, GREAT TOE
Anesthesia: General | Site: Foot | Laterality: Right

## 2018-11-16 MED ORDER — CLONIDINE HCL (ANALGESIA) 100 MCG/ML EP SOLN
EPIDURAL | Status: DC | PRN
  Administered 2018-11-16: 100 ug

## 2018-11-16 MED ORDER — CELECOXIB 200 MG PO CAPS
200.0000 mg | ORAL_CAPSULE | Freq: Once | ORAL | Status: AC
  Administered 2018-11-16: 200 mg via ORAL

## 2018-11-16 MED ORDER — CEFAZOLIN SODIUM-DEXTROSE 2-4 GM/100ML-% IV SOLN
INTRAVENOUS | Status: AC
  Filled 2018-11-16: qty 100

## 2018-11-16 MED ORDER — FENTANYL CITRATE (PF) 100 MCG/2ML IJ SOLN
50.0000 ug | INTRAMUSCULAR | Status: DC | PRN
  Administered 2018-11-16: 50 ug via INTRAVENOUS

## 2018-11-16 MED ORDER — FENTANYL CITRATE (PF) 100 MCG/2ML IJ SOLN: 25.0000 ug | INTRAMUSCULAR | Status: DC | PRN

## 2018-11-16 MED ORDER — SENNA 8.6 MG PO TABS: 2.0000 | ORAL_TABLET | Freq: Two times a day (BID) | ORAL | 0 refills | Status: DC

## 2018-11-16 MED ORDER — PROPOFOL 10 MG/ML IV BOLUS
INTRAVENOUS | Status: DC | PRN
  Administered 2018-11-16: 150 mg via INTRAVENOUS

## 2018-11-16 MED ORDER — DEXAMETHASONE SODIUM PHOSPHATE 10 MG/ML IJ SOLN
INTRAMUSCULAR | Status: DC | PRN
  Administered 2018-11-16: 10 mg via INTRAVENOUS

## 2018-11-16 MED ORDER — CELECOXIB 200 MG PO CAPS
ORAL_CAPSULE | ORAL | Status: AC
  Filled 2018-11-16: qty 1

## 2018-11-16 MED ORDER — ACETAMINOPHEN 500 MG PO TABS
ORAL_TABLET | ORAL | Status: AC
  Filled 2018-11-16: qty 2

## 2018-11-16 MED ORDER — FENTANYL CITRATE (PF) 100 MCG/2ML IJ SOLN
INTRAMUSCULAR | Status: AC
  Filled 2018-11-16: qty 2

## 2018-11-16 MED ORDER — DOCUSATE SODIUM 100 MG PO CAPS: 100.0000 mg | ORAL_CAPSULE | Freq: Two times a day (BID) | ORAL | 0 refills | Status: DC

## 2018-11-16 MED ORDER — PROMETHAZINE HCL 25 MG/ML IJ SOLN: 6.2500 mg | INTRAMUSCULAR | Status: DC | PRN

## 2018-11-16 MED ORDER — MIDAZOLAM HCL 2 MG/2ML IJ SOLN
INTRAMUSCULAR | Status: AC
  Filled 2018-11-16: qty 2

## 2018-11-16 MED ORDER — BUPIVACAINE-EPINEPHRINE (PF) 0.5% -1:200000 IJ SOLN
INTRAMUSCULAR | Status: DC | PRN
  Administered 2018-11-16: 10 mL via PERINEURAL
  Administered 2018-11-16: 20 mL via PERINEURAL

## 2018-11-16 MED ORDER — SODIUM CHLORIDE 0.9 % IV SOLN: INTRAVENOUS | Status: DC

## 2018-11-16 MED ORDER — BUPIVACAINE-EPINEPHRINE (PF) 0.5% -1:200000 IJ SOLN
INTRAMUSCULAR | Status: AC
  Filled 2018-11-16: qty 60

## 2018-11-16 MED ORDER — ONDANSETRON HCL 4 MG/2ML IJ SOLN
INTRAMUSCULAR | Status: DC | PRN
  Administered 2018-11-16: 4 mg via INTRAVENOUS

## 2018-11-16 MED ORDER — GABAPENTIN 300 MG PO CAPS
300.0000 mg | ORAL_CAPSULE | Freq: Once | ORAL | Status: AC
  Administered 2018-11-16: 300 mg via ORAL

## 2018-11-16 MED ORDER — GABAPENTIN 300 MG PO CAPS
ORAL_CAPSULE | ORAL | Status: AC
  Filled 2018-11-16: qty 1

## 2018-11-16 MED ORDER — CEFAZOLIN SODIUM-DEXTROSE 2-4 GM/100ML-% IV SOLN
2.0000 g | INTRAVENOUS | Status: AC
  Administered 2018-11-16: 07:00:00 2 g via INTRAVENOUS

## 2018-11-16 MED ORDER — OXYCODONE HCL 5 MG PO TABS: 5.0000 mg | ORAL_TABLET | Freq: Four times a day (QID) | ORAL | 0 refills | Status: AC | PRN

## 2018-11-16 MED ORDER — LACTATED RINGERS IV SOLN
INTRAVENOUS | Status: DC
  Administered 2018-11-16: 07:00:00 via INTRAVENOUS

## 2018-11-16 MED ORDER — PROPOFOL 500 MG/50ML IV EMUL
INTRAVENOUS | Status: DC | PRN
  Administered 2018-11-16: 25 ug/kg/min via INTRAVENOUS

## 2018-11-16 MED ORDER — SCOPOLAMINE 1 MG/3DAYS TD PT72: 1.0000 | MEDICATED_PATCH | Freq: Once | TRANSDERMAL | Status: DC

## 2018-11-16 MED ORDER — CHLORHEXIDINE GLUCONATE 4 % EX LIQD: 60.0000 mL | Freq: Once | CUTANEOUS | Status: DC

## 2018-11-16 MED ORDER — ACETAMINOPHEN 500 MG PO TABS
1000.0000 mg | ORAL_TABLET | Freq: Once | ORAL | Status: AC
  Administered 2018-11-16: 1000 mg via ORAL

## 2018-11-16 MED ORDER — LIDOCAINE HCL (CARDIAC) PF 100 MG/5ML IV SOSY
PREFILLED_SYRINGE | INTRAVENOUS | Status: DC | PRN
  Administered 2018-11-16: 30 mg via INTRAVENOUS

## 2018-11-16 MED ORDER — MIDAZOLAM HCL 2 MG/2ML IJ SOLN
1.0000 mg | INTRAMUSCULAR | Status: DC | PRN
  Administered 2018-11-16: 2 mg via INTRAVENOUS

## 2018-11-16 SURGICAL SUPPLY — 85 items
APL PRP STRL LF DISP 70% ISPRP (MISCELLANEOUS) ×1
BANDAGE ESMARK 6X9 LF (GAUZE/BANDAGES/DRESSINGS) IMPLANT
BIT DRILL 2.7XCANN QCK CNCT (BIT) IMPLANT
BIT DRILL CANN 2.7 (BIT) ×2
BIT DRILL CANN 2.7MM (BIT) ×1
BIT DRILL Q-C 2.0 DIA 100 (BIT) ×1 IMPLANT
BIT DRILL Q-C 2.0MM DIA 100MM (BIT) ×1
BIT DRL 2.7XCANN QCK CNCT (BIT) ×1
BLADE AVERAGE 25MMX9MM (BLADE)
BLADE AVERAGE 25X9 (BLADE) IMPLANT
BLADE MICRO SAGITTAL (BLADE) IMPLANT
BLADE OSC/SAG .038X5.5 CUT EDG (BLADE) IMPLANT
BLADE SURG 15 STRL LF DISP TIS (BLADE) ×2 IMPLANT
BLADE SURG 15 STRL SS (BLADE) ×6
BNDG CMPR 9X6 STRL LF SNTH (GAUZE/BANDAGES/DRESSINGS)
BNDG COHESIVE 4X5 TAN STRL (GAUZE/BANDAGES/DRESSINGS) IMPLANT
BNDG COHESIVE 6X5 TAN STRL LF (GAUZE/BANDAGES/DRESSINGS) IMPLANT
BNDG CONFORM 3 STRL LF (GAUZE/BANDAGES/DRESSINGS) ×3 IMPLANT
BNDG ELASTIC 4X5.8 VLCR STR LF (GAUZE/BANDAGES/DRESSINGS) IMPLANT
BNDG ESMARK 6X9 LF (GAUZE/BANDAGES/DRESSINGS)
BOOT STEPPER DURA LG (SOFTGOODS) IMPLANT
BOOT STEPPER DURA MED (SOFTGOODS) IMPLANT
BOOT STEPPER DURA SM (SOFTGOODS) ×2 IMPLANT
BOOT STEPPER DURA XLG (SOFTGOODS) IMPLANT
CHLORAPREP W/TINT 26 (MISCELLANEOUS) ×3 IMPLANT
COVER BACK TABLE REUSABLE LG (DRAPES) ×3 IMPLANT
COVER WAND RF STERILE (DRAPES) IMPLANT
CUFF TOURN SGL QUICK 34 (TOURNIQUET CUFF)
CUFF TRNQT CYL 34X4.125X (TOURNIQUET CUFF) IMPLANT
DRAPE EXTREMITY T 121X128X90 (DISPOSABLE) ×3 IMPLANT
DRAPE HALF SHEET 70X43 (DRAPES) ×3 IMPLANT
DRAPE OEC MINIVIEW 54X84 (DRAPES) ×3 IMPLANT
DRAPE U-SHAPE 47X51 STRL (DRAPES) ×3 IMPLANT
DRSG MEPITEL 4X7.2 (GAUZE/BANDAGES/DRESSINGS) ×3 IMPLANT
DRSG PAD ABDOMINAL 8X10 ST (GAUZE/BANDAGES/DRESSINGS) ×6 IMPLANT
ELECT REM PT RETURN 9FT ADLT (ELECTROSURGICAL) ×3
ELECTRODE REM PT RTRN 9FT ADLT (ELECTROSURGICAL) ×1 IMPLANT
GAUZE SPONGE 4X4 12PLY STRL (GAUZE/BANDAGES/DRESSINGS) ×3 IMPLANT
GLOVE BIO SURGEON STRL SZ7 (GLOVE) ×2 IMPLANT
GLOVE BIO SURGEON STRL SZ8 (GLOVE) ×3 IMPLANT
GLOVE BIOGEL PI IND STRL 7.5 (GLOVE) IMPLANT
GLOVE BIOGEL PI IND STRL 8 (GLOVE) ×2 IMPLANT
GLOVE BIOGEL PI INDICATOR 7.5 (GLOVE) ×4
GLOVE BIOGEL PI INDICATOR 8 (GLOVE) ×4
GLOVE ECLIPSE 8.0 STRL XLNG CF (GLOVE) ×3 IMPLANT
GOWN STRL REUS W/ TWL LRG LVL3 (GOWN DISPOSABLE) ×1 IMPLANT
GOWN STRL REUS W/ TWL XL LVL3 (GOWN DISPOSABLE) ×2 IMPLANT
GOWN STRL REUS W/TWL LRG LVL3 (GOWN DISPOSABLE) ×3
GOWN STRL REUS W/TWL XL LVL3 (GOWN DISPOSABLE) ×6
K-WIRE ACE 1.6X6 (WIRE) ×3
KWIRE ACE 1.6X6 (WIRE) IMPLANT
NEEDLE HYPO 22GX1.5 SAFETY (NEEDLE) IMPLANT
PACK BASIN DAY SURGERY FS (CUSTOM PROCEDURE TRAY) ×3 IMPLANT
PAD CAST 4YDX4 CTTN HI CHSV (CAST SUPPLIES) ×1 IMPLANT
PADDING CAST ABS 4INX4YD NS (CAST SUPPLIES)
PADDING CAST ABS COTTON 4X4 ST (CAST SUPPLIES) IMPLANT
PADDING CAST COTTON 4X4 STRL (CAST SUPPLIES) ×3
PADDING CAST COTTON 6X4 STRL (CAST SUPPLIES) IMPLANT
PENCIL BUTTON HOLSTER BLD 10FT (ELECTRODE) ×3 IMPLANT
PLATE TUBULAR 31 4H (Plate) ×2 IMPLANT
SANITIZER HAND PURELL 535ML FO (MISCELLANEOUS) ×3 IMPLANT
SCREW CANN 1/2THREAD 4.0X32 (Screw) ×2 IMPLANT
SCREW CORT 2.5X20X2.7XST SM (Screw) IMPLANT
SCREW CORTICAL 2.7X18MM (Screw) ×4 IMPLANT
SCREW CORTICAL 2.7X20MM (Screw) ×6 IMPLANT
SLEEVE SCD COMPRESS KNEE MED (MISCELLANEOUS) ×3 IMPLANT
SPLINT FAST PLASTER 5X30 (CAST SUPPLIES)
SPLINT PLASTER CAST FAST 5X30 (CAST SUPPLIES) IMPLANT
SPONGE LAP 18X18 RF (DISPOSABLE) ×3 IMPLANT
SPONGE SURGIFOAM ABS GEL 12-7 (HEMOSTASIS) IMPLANT
STOCKINETTE 6  STRL (DRAPES) ×2
STOCKINETTE 6 STRL (DRAPES) ×1 IMPLANT
SUCTION FRAZIER HANDLE 10FR (MISCELLANEOUS) ×2
SUCTION TUBE FRAZIER 10FR DISP (MISCELLANEOUS) ×1 IMPLANT
SUT ETHILON 3 0 PS 1 (SUTURE) ×3 IMPLANT
SUT MNCRL AB 3-0 PS2 18 (SUTURE) ×3 IMPLANT
SUT VIC AB 0 SH 27 (SUTURE) IMPLANT
SUT VIC AB 2-0 SH 27 (SUTURE) ×3
SUT VIC AB 2-0 SH 27XBRD (SUTURE) ×1 IMPLANT
SYR BULB 3OZ (MISCELLANEOUS) ×3 IMPLANT
SYR CONTROL 10ML LL (SYRINGE) IMPLANT
TOWEL GREEN STERILE FF (TOWEL DISPOSABLE) ×6 IMPLANT
TUBE CONNECTING 20'X1/4 (TUBING) ×1
TUBE CONNECTING 20X1/4 (TUBING) ×2 IMPLANT
UNDERPAD 30X30 (UNDERPADS AND DIAPERS) ×3 IMPLANT

## 2018-11-16 NOTE — Discharge Instructions (Signed)

## 2018-11-16 NOTE — Progress Notes (Signed)
Assisted Dr. Turk with right, ultrasound guided, popliteal, adductor canal block. Side rails up, monitors on throughout procedure. See vital signs in flow sheet. Tolerated Procedure well. 

## 2018-11-16 NOTE — Anesthesia Procedure Notes (Signed)
Anesthesia Regional Block: Popliteal block   Pre-Anesthetic Checklist: ,, timeout performed, Correct Patient, Correct Site, Correct Laterality, Correct Procedure, Correct Position, site marked, Risks and benefits discussed,  Surgical consent,  Pre-op evaluation,  At surgeon's request and post-op pain management  Laterality: Right  Prep: chloraprep       Needles:  Injection technique: Single-shot  Needle Type: Echogenic Needle     Needle Length: 9cm  Needle Gauge: 21     Additional Needles:   Procedures:,,,, ultrasound used (permanent image in chart),,,,  Narrative:  Start time: 11/16/2018 7:00 AM End time: 11/16/2018 7:10 AM Injection made incrementally with aspirations every 5 mL.  Performed by: Personally  Anesthesiologist: Catalina Gravel, MD  Additional Notes: No pain on injection. No increased resistance to injection. Injection made in 5cc increments.  Good needle visualization.  Patient tolerated procedure well.

## 2018-11-16 NOTE — Anesthesia Procedure Notes (Signed)
Procedure Name: LMA Insertion Date/Time: 11/16/2018 7:31 AM Performed by: Signe Colt, CRNA Pre-anesthesia Checklist: Patient identified, Emergency Drugs available, Suction available and Patient being monitored Patient Re-evaluated:Patient Re-evaluated prior to induction Oxygen Delivery Method: Circle system utilized Preoxygenation: Pre-oxygenation with 100% oxygen Induction Type: IV induction Ventilation: Mask ventilation without difficulty LMA: LMA inserted LMA Size: 4.0 Number of attempts: 1 Airway Equipment and Method: Bite block Placement Confirmation: positive ETCO2 Tube secured with: Tape Dental Injury: Teeth and Oropharynx as per pre-operative assessment

## 2018-11-16 NOTE — H&P (Signed)
Gina Fuentes is an 48 y.o. female.   Chief Complaint: right foot pain HPI: The patient is a 48 year old female without significant past medical history.  She has a long history of right forefoot pain due to hallux rigidus.  She has failed nonoperative treatment to date including activity modification, oral anti-inflammatories and shoewear modification.  She presents now for hallux MP joint arthrodesis.  Past Medical History:  Diagnosis Date  . Depression   . Hypertension     Past Surgical History:  Procedure Laterality Date  . BREAST SURGERY    . MOUTH SURGERY    . TIBIA IM NAIL INSERTION Left 02/04/2015   Procedure: INTRAMEDULLARY (IM) NAIL TIBIAL;  Surgeon: Nadara MustardMarcus Duda V, MD;  Location: [facility] OR;  Service: Orthopedics;  Laterality: Left;  . TONSILLECTOMY      History reviewed. No pertinent family history. Social History:  reports that she has been smoking. She has been smoking about 0.50 packs per day. She has never used smokeless tobacco. She reports current alcohol use of about 10.0 standard drinks of alcohol per week. She reports that she does not use drugs.  Allergies:  Allergies  Allergen Reactions  . Penicillins Other (See Comments)    Childhood allergic reaction - pt was never told the reaction and does not recall whether or not she was hospitalized.    Medications Prior to Admission  Medication Sig Dispense Refill  . Aspirin-Acetaminophen-Caffeine (GOODY HEADACHE PO) Take 2 packets by mouth 2 (two) times daily as needed (pain).    Marland Kitchen. FLUoxetine (PROZAC) 20 MG capsule Take 80 mg by mouth at bedtime.    Marland Kitchen. lisinopril (ZESTRIL) 10 MG tablet Take 10 mg by mouth daily.    . varenicline (CHANTIX PAK) 0.5 MG X 11 & 1 MG X 42 tablet Take 2 (two) times daily by mouth. Take one 0.5 mg tablet by mouth once daily for 3 days, then increase to one 0.5 mg tablet twice daily for 4 days, then increase to one 1 mg tablet twice daily.    Marland Kitchen. aspirin EC 325 MG tablet Take 975 mg by mouth every 6  (six) hours as needed (pain).    Marland Kitchen. aspirin EC 325 MG tablet Take 1 tablet (325 mg total) by mouth daily. 30 tablet 0  . HYDROcodone-acetaminophen (NORCO) 5-325 MG tablet Take 1-2 tablets by mouth every 6 (six) hours as needed for moderate pain. (Patient not taking: Reported on 03/15/2017) 60 tablet 0  . levonorgestrel (MIRENA) 20 MCG/24HR IUD 1 each by Intrauterine route once. Implanted June 2016    . oxyCODONE (ROXICODONE) 5 MG immediate release tablet Take 1-2 tablets (5-10 mg total) by mouth every 4 (four) hours as needed for severe pain. (Patient not taking: Reported on 03/15/2017) 20 tablet 0    Results for orders placed or performed during the hospital encounter of 11/16/18 (from the past 48 hour(s))  Pregnancy, urine POC     Status: None   Collection Time: 11/16/18  6:34 AM  Result Value Ref Range   Preg Test, Ur NEGATIVE NEGATIVE    Comment:        THE SENSITIVITY OF THIS METHODOLOGY IS >24 mIU/mL    No results found.  ROS no recent fever, chills, nausea, vomiting or changes in her appetite  Blood pressure 117/71, pulse 72, temperature 98.6 F (37 C), temperature source Oral, resp. rate 10, height 5\' 2"  (1.575 m), weight 59.1 kg, SpO2 100 %. Physical Exam  Well-nourished well-developed woman in no apparent distress.  Alert and oriented x4.  Mood and affect are normal.  Extraocular motions are intact.  Respirations are unlabored.  Normal gait.  The right hallux has decreased range of motion at the MP joint.  Skin is healthy and intact.  There is a prominent dorsal bursa.  No lymphadenopathy.  Pulses are palpable.  5 out of 5 strength in plantarflexion and dorsiflexion of the ankle and toes.  Assessment/Plan Right hallux rigidus -to the operating room today for hallux MP joint arthrodesis.  The risks and benefits of the alternative treatment options have been discussed in detail.  The patient wishes to proceed with surgery and specifically understands risks of bleeding, infection,  nerve damage, blood clots, need for additional surgery, amputation and death.   Gina Simmer, MD 2018-12-01, 7:21 AM

## 2018-11-16 NOTE — Anesthesia Postprocedure Evaluation (Signed)
Anesthesia Post Note  Patient: Gina Fuentes  Procedure(s) Performed: Right hallux metatarsal phalangeal joint arthrodesis (Right Foot)     Patient location during evaluation: PACU Anesthesia Type: General Level of consciousness: awake and alert Pain management: pain level controlled Vital Signs Assessment: post-procedure vital signs reviewed and stable Respiratory status: spontaneous breathing, nonlabored ventilation, respiratory function stable and patient connected to nasal cannula oxygen Cardiovascular status: blood pressure returned to baseline and stable Postop Assessment: no apparent nausea or vomiting Anesthetic complications: no    Last Vitals:  Vitals:   11/16/18 0900 11/16/18 1016  BP: 113/76 125/76  Pulse: 91 84  Resp: 13 18  Temp:  36.7 C  SpO2: 96% 99%    Last Pain:  Vitals:   11/16/18 1016  TempSrc:   PainSc: 0-No pain                 Catalina Gravel

## 2018-11-16 NOTE — Transfer of Care (Signed)
Immediate Anesthesia Transfer of Care Note  Patient: Gina Fuentes  Procedure(s) Performed: Right hallux metatarsal phalangeal joint arthrodesis (Right Foot)  Patient Location: PACU  Anesthesia Type:GA combined with regional for post-op pain  Level of Consciousness: drowsy and patient cooperative  Airway & Oxygen Therapy: Patient Spontanous Breathing and Patient connected to nasal cannula oxygen  Post-op Assessment: Report given to RN and Post -op Vital signs reviewed and stable  Post vital signs: Reviewed and stable  Last Vitals:  Vitals Value Taken Time  BP    Temp    Pulse 88 11/16/18 0833  Resp 12 11/16/18 0833  SpO2 100 % 11/16/18 0833  Vitals shown include unvalidated device data.  Last Pain:  Vitals:   11/16/18 0640  TempSrc: Oral  PainSc: 0-No pain         Complications: No apparent anesthesia complications

## 2018-11-16 NOTE — Anesthesia Procedure Notes (Signed)
Anesthesia Regional Block: Adductor canal block   Pre-Anesthetic Checklist: ,, timeout performed, Correct Patient, Correct Site, Correct Laterality, Correct Procedure, Correct Position, site marked, Risks and benefits discussed,  Surgical consent,  Pre-op evaluation,  At surgeon's request and post-op pain management  Laterality: Right  Prep: chloraprep       Needles:  Injection technique: Single-shot  Needle Type: Echogenic Needle     Needle Length: 9cm  Needle Gauge: 21     Additional Needles:   Procedures:,,,, ultrasound used (permanent image in chart),,,,  Narrative:  Start time: 11/16/2018 7:10 AM End time: 11/16/2018 7:16 AM Injection made incrementally with aspirations every 5 mL.  Performed by: Personally  Anesthesiologist: Catalina Gravel, MD  Additional Notes: No pain on injection. No increased resistance to injection. Injection made in 5cc increments.  Good needle visualization.  Patient tolerated procedure well.

## 2018-11-16 NOTE — Op Note (Signed)
11/16/2018  8:36 AM  PATIENT:  Gina Fuentes  48 y.o. female  PRE-OPERATIVE DIAGNOSIS:  right foot hallux rigidus  POST-OPERATIVE DIAGNOSIS: 1.  right foot hallux rigidus      2.  Right foot ganglion cyst  Procedure(s): 1.  Excision of right foot ganglion cyst 2.  Right hallux metatarsal phalangeal joint arthrodesis 3.  Right foot AP and lateral radiographs  SURGEON:  Toni ArthursJohn Braelyn Jenson, MD  ASSISTANT: Alfredo MartinezJustin Ollis, PA-C  ANESTHESIA:   General, regional  EBL:  minimal   TOURNIQUET:   Total Tourniquet Time Documented: Thigh (Right) - 42 minutes Total: Thigh (Right) - 42 minutes  COMPLICATIONS:  None apparent  DISPOSITION:  Extubated, awake and stable to recovery.  INDICATION FOR PROCEDURE: The patient is a 48 year old female with a past medical history significant for cigarette smoking.  She has a long history of right forefoot pain due to hallux rigidus.  She presents now for operative treatment of this painful and limiting condition having failed nonoperative treatment to date.  The risks and benefits of the alternative treatment options have been discussed in detail.  The patient wishes to proceed with surgery and specifically understands risks of bleeding, infection, nerve damage, blood clots, need for additional surgery, amputation and death.  PROCEDURE IN DETAIL:  After pre operative consent was obtained, and the correct operative site was identified, the patient was brought to the operating room and placed supine on the OR table.  Anesthesia was administered.  Pre-operative antibiotics were administered.  A surgical timeout was taken.  The right lower extremity was prepped and draped in standard sterile fashion with a tourniquet around the thigh.  The extremity was elevated and the tourniquet was inflated to 250 mmHg.  A longitudinal incision was then made over the hallux MP joint.  Dissection was carried down through the skin.  Immediately evident was a prominent subcutaneous mass  overlying the hallux MP joint.  This was a cystic structure with translucent fluid consistent with a ganglion cyst.  Careful dissection was carried circumferentially around the mass freeing it from the adjacent extensor pollicis longus tendon and dorsal joint capsule.  The mass was excised in its entirety and passed off the field as a specimen to pathology.  The dorsal joint capsule was then incised and elevated medially and laterally.  The collateral ligaments were released exposing the metatarsal head.  Severe degenerative changes were noted on both sides of the joint with several loose bodies within the joint.  These were all removed.  Peripheral osteophytes were removed with a rondure.  A K wire was then placed in the center of the metatarsal head.  An 18 mm concave reamer was used to remove the remaining articular cartilage and subchondral bone.  A K wire was then placed in the base of the proximal phalanx.  An 18 mm convex reamer was used to remove the remaining articular cartilage and subchondral bone.  The wound was irrigated copiously removing all debris.  A 2 mm drill bit was used to perforate the base of the proximal phalanx and the metatarsal head leaving the resultant bone graft in place.  The joint was reduced and provisionally pinned.  Radiographs confirmed appropriate alignment of the joint.  A simulated weightbearing examination showed appropriate dorsiflexion of the toe.  The joint was then compressed with a 4 mm Zimmer partially-threaded cannulated screw.  A 4 hole Zimmer one quarter tubular plate was then applied across the dorsum of the joint and secured proximally and  distally with bicortical 2.7 millimeter screws.  Final AP and lateral radiographs confirmed appropriate position and length of all hardware and appropriate reduction of the hallux MP joint.  The wound was then irrigated copiously.  The dorsal joint capsule was repaired with 2-0 Vicryl simple sutures.  Subcutaneous tissues  were approximated with inverted 3-0 Monocryl sutures.  The incision was closed with 3-0 nylon.  Sterile dressings were applied followed by a compression wrap and a cam boot.  Tourniquet was released after application of the dressings.  The patient was awakened from anesthesia and transported to the recovery room in stable condition.  FOLLOW UP PLAN: Weightbearing as tolerated on the right heel in a cam boot.  Follow-up in the office in 2 weeks for suture removal and to continue weightbearing in the cam boot.  RADIOGRAPHS: AP and lateral radiographs of the right foot are obtained intraoperatively.  These show interval arthrodesis of the hallux MP joint.  Hardware is appropriately positioned and of the appropriate lengths.    Gina Claude PA-C was present and scrubbed for the duration of the operative case. His assistance was essential in positioning the patient, prepping and draping, gaining and maintaining exposure, performing the operation, closing and dressing the wounds and applying the splint.

## 2018-11-17 ENCOUNTER — Encounter (HOSPITAL_BASED_OUTPATIENT_CLINIC_OR_DEPARTMENT_OTHER): Payer: Self-pay | Admitting: Orthopedic Surgery

## 2019-02-14 ENCOUNTER — Other Ambulatory Visit: Payer: Self-pay | Admitting: Family Medicine

## 2019-02-14 ENCOUNTER — Other Ambulatory Visit (HOSPITAL_COMMUNITY)
Admission: RE | Admit: 2019-02-14 | Discharge: 2019-02-14 | Disposition: A | Discharge status: Home / Self Care | Payer: Medicaid Other | Source: Ambulatory Visit | Attending: Family Medicine | Admitting: Family Medicine

## 2019-02-14 DIAGNOSIS — Z0000 Encounter for general adult medical examination without abnormal findings: Secondary | ICD-10-CM | POA: Diagnosis not present

## 2019-02-21 LAB — CYTOLOGY - PAP
Comment: NEGATIVE
Diagnosis: NEGATIVE
High risk HPV: NEGATIVE

## 2019-05-01 ENCOUNTER — Other Ambulatory Visit: Payer: Self-pay | Admitting: Obstetrics and Gynecology

## 2019-05-01 DIAGNOSIS — Z1231 Encounter for screening mammogram for malignant neoplasm of breast: Secondary | ICD-10-CM

## 2019-06-13 ENCOUNTER — Ambulatory Visit
Admission: RE | Admit: 2019-06-13 | Discharge: 2019-06-13 | Disposition: A | Discharge status: Home / Self Care | Payer: Medicaid Other | Source: Ambulatory Visit | Attending: Obstetrics and Gynecology | Admitting: Obstetrics and Gynecology

## 2019-06-13 ENCOUNTER — Other Ambulatory Visit: Payer: Self-pay

## 2019-06-13 DIAGNOSIS — Z1231 Encounter for screening mammogram for malignant neoplasm of breast: Secondary | ICD-10-CM

## 2019-07-09 ENCOUNTER — Encounter (HOSPITAL_COMMUNITY): Payer: Self-pay | Admitting: Emergency Medicine

## 2019-07-09 ENCOUNTER — Emergency Department (HOSPITAL_COMMUNITY)
Admission: EM | Admit: 2019-07-09 | Discharge: 2019-07-09 | Disposition: A | Discharge status: Home / Self Care | Payer: No Typology Code available for payment source | Attending: Emergency Medicine | Admitting: Emergency Medicine

## 2019-07-09 ENCOUNTER — Other Ambulatory Visit: Payer: Self-pay

## 2019-07-09 ENCOUNTER — Emergency Department (HOSPITAL_COMMUNITY): Payer: No Typology Code available for payment source

## 2019-07-09 DIAGNOSIS — F17210 Nicotine dependence, cigarettes, uncomplicated: Secondary | ICD-10-CM | POA: Diagnosis not present

## 2019-07-09 DIAGNOSIS — Z79899 Other long term (current) drug therapy: Secondary | ICD-10-CM | POA: Insufficient documentation

## 2019-07-09 DIAGNOSIS — S42321A Displaced transverse fracture of shaft of humerus, right arm, initial encounter for closed fracture: Secondary | ICD-10-CM | POA: Diagnosis not present

## 2019-07-09 DIAGNOSIS — Y9352 Activity, horseback riding: Secondary | ICD-10-CM | POA: Insufficient documentation

## 2019-07-09 DIAGNOSIS — Y999 Unspecified external cause status: Secondary | ICD-10-CM | POA: Insufficient documentation

## 2019-07-09 DIAGNOSIS — Y929 Unspecified place or not applicable: Secondary | ICD-10-CM | POA: Insufficient documentation

## 2019-07-09 DIAGNOSIS — I10 Essential (primary) hypertension: Secondary | ICD-10-CM | POA: Diagnosis not present

## 2019-07-09 DIAGNOSIS — Z7982 Long term (current) use of aspirin: Secondary | ICD-10-CM | POA: Diagnosis not present

## 2019-07-09 DIAGNOSIS — S4991XA Unspecified injury of right shoulder and upper arm, initial encounter: Secondary | ICD-10-CM | POA: Diagnosis present

## 2019-07-09 MED ORDER — NAPROXEN 500 MG PO TABS: 500.0000 mg | ORAL_TABLET | Freq: Two times a day (BID) | ORAL | 0 refills | Status: DC

## 2019-07-09 MED ORDER — MORPHINE SULFATE 15 MG PO TABS: 7.5000 mg | ORAL_TABLET | ORAL | 0 refills | Status: DC | PRN

## 2019-07-09 MED ORDER — HYDROMORPHONE HCL 1 MG/ML IJ SOLN
1.0000 mg | Freq: Once | INTRAMUSCULAR | Status: AC
  Administered 2019-07-09: 1 mg via INTRAMUSCULAR
  Filled 2019-07-09: qty 1

## 2019-07-09 MED ORDER — KETOROLAC TROMETHAMINE 60 MG/2ML IM SOLN
60.0000 mg | Freq: Once | INTRAMUSCULAR | Status: AC
  Administered 2019-07-09: 60 mg via INTRAMUSCULAR
  Filled 2019-07-09: qty 2

## 2019-07-09 MED ORDER — OXYCODONE-ACETAMINOPHEN 5-325 MG PO TABS: 1.0000 | ORAL_TABLET | Freq: Four times a day (QID) | ORAL | 0 refills | Status: DC | PRN

## 2019-07-09 MED ORDER — FENTANYL CITRATE (PF) 100 MCG/2ML IJ SOLN
100.0000 ug | Freq: Once | INTRAMUSCULAR | Status: AC
  Administered 2019-07-09: 100 ug via INTRAMUSCULAR
  Filled 2019-07-09: qty 2

## 2019-07-09 NOTE — Discharge Instructions (Addendum)
1.  Follow-up as per your discussion with Dr. Victorino Dike. 2.  Follow instructions for humerus fracture in a splint. 3.  Take naproxen (Aleve) as prescribed.  If you require additional pain control, you may take 1-2 Percocet tablets every 6 hours.  Percocet can cause constipation.  If you tend to get constipated take an over-the-counter stool softener such as Colace.  Percocet can be addictive.  Stop taking it as soon as your pain is adequately controlled by Aleve. 4.  Return to the emergency department if you get numbness or weakness of your hand.  Your hand feels cold or severely increasing pain.

## 2019-07-09 NOTE — Consult Note (Signed)
Reason for Consult: Right arm pain Referring Physician: Dr. Lamont Dowdy Gina Fuentes is an 49 y.o. female.  HPI: The patient is a 49 year old right-hand-dominant female known to me from previous right forefoot surgery.  She complains of right arm pain after a fall from her horse earlier today.  She presented to the emergency room at [facility] long after the injury.  She complains of pain in the arm with any motion.  She denies numbness or tingling to her hand.  She denies any history of injury or surgery to this arm in the past.  She smokes about a half a pack of cigarettes a day.  She works as a Child psychotherapist.  Past Medical History:  Diagnosis Date  . Depression   . Hypertension     Past Surgical History:  Procedure Laterality Date  . ARTHRODESIS METATARSALPHALANGEAL JOINT (MTPJ) Right 11/16/2018   Procedure: Right hallux metatarsal phalangeal joint arthrodesis;  Surgeon: Toni Arthurs, MD;  Location: [facility];  Service: Orthopedics;  Laterality: Right;   . AUGMENTATION MAMMAPLASTY Bilateral   . BREAST SURGERY    . MOUTH SURGERY    . TIBIA IM NAIL INSERTION Left 02/04/2015   Procedure: INTRAMEDULLARY (IM) NAIL TIBIAL;  Surgeon: Nadara Mustard, MD;  Location: [facility] OR;  Service: Orthopedics;  Laterality: Left;  . TONSILLECTOMY      No family history on file.  Social History:  reports that she has been smoking. She has been smoking about 0.50 packs per day. She has never used smokeless tobacco. She reports current alcohol use of about 10.0 standard drinks of alcohol per week. She reports that she does not use drugs.  Allergies:  Allergies  Allergen Reactions  . Penicillins Other (See Comments)    Childhood allergic reaction - pt was never told the reaction and does not recall whether or not she was hospitalized.  Did it involve swelling of the face/tongue/throat, SOB, or low BP? Y Did it involve sudden or severe rash/hives, skin peeling, or any reaction on the inside  of your mouth or nose? Y Did you need to seek medical attention at a hospital or doctor's office? UNK When did it last happen?Childhood If all above answers are "NO", may proceed with cephalosporin use.     Medications: I have reviewed the patient's current medications.  No results found for this or any previous visit (from the past 48 hour(s)).  DG Humerus Right  Result Date: 07/09/2019 CLINICAL DATA:  Pt reports her horse threw her off today. She landed on right arm. Severe right humeral pain at midshaft. EXAM: RIGHT HUMERUS - 2+ VIEW COMPARISON:  None. FINDINGS: There is a displaced transverse fracture through the mid right humerus. Displacement on lateral view measures approximately 2 cm. At no evidence of dislocation at the shoulder or elbow joints. Regional soft tissues unremarkable. There is a possible tiny displaced bone fragment along the lateral condyle of the elbow. IMPRESSION: 1.  Displaced fracture through the mid right humerus. 2. Possible tiny displaced fragment along the lateral condyle of the elbow. Recommend dedicated radiographs of the right elbow. Electronically Signed   By: Emmaline Kluver M.D.   On: 07/09/2019 17:09    ROS: No recent fever, chills, nausea, vomiting or changes in her appetite PE:  Blood pressure (!) 160/94, pulse 79, resp. rate 18, height 5\' 1"  (1.549 m), weight 56.7 kg, SpO2 99 %. Well-nourished well-developed woman in no apparent distress.  Alert and oriented x4.  Mood and affect  are normal.  Extraocular motions are intact.  Respirations are unlabored.  Right upper extremity shows gross deformity with apex anterior angulation and swelling about the arm.  Skin is intact.  Radial pulses 2+.  Intact sensibility to light touch in the radial, ulnar and median nerve distributions.  5 out of 5 strength at the radial, ulnar and median nerve distributions.  No lymphadenopathy.  X-rays: AP and lateral radiographs of the right humerus show a midshaft fracture  that is displaced with apex anterolateral angulation.  The pattern is partial transverse and partial oblique without significant comminution.  Assessment/Plan: Right humeral shaft fracture -displaced and closed -I explained the nature of the injury to the patient in detail.  We discussed treatment options including open reduction and internal fixation versus closed treatment in a coaptation splint converting to a Sarmiento brace when the fracture becomes sticky.  She will follow-up in the office in the next day or 2 for evaluation by one of our upper extremity specialists.  In the meantime she will stay in a coaptation splint and allow the arm to hang.  Pain medicine per emergency room MD.  She understands this plan and agrees.  [name] 07/09/2019, 7:15 PM

## 2019-07-09 NOTE — ED Provider Notes (Addendum)
Patient has midshaft humerus fracture from fall off of her horse.  She denies that she has any headache.  She had no loss of consciousness.  She is not on anticoagulants. Physical Exam  BP (!) 135/99 (BP Location: Left Arm)   Pulse 66   Resp 20   Ht 5\' 1"  (1.549 m)   Wt 56.7 kg   SpO2 100%   BMI 23.62 kg/m   Physical Exam Constitutional:      Appearance: Normal appearance.  Pulmonary:     Effort: Pulmonary effort is normal.  Musculoskeletal:     Comments: Deformity mid humerus.  Radial pulse intact.  Grip strength intact.  Neurological:     General: No focal deficit present.     Mental Status: She is alert and oriented to person, place, and time.  Psychiatric:        Mood and Affect: Mood normal.     ED Course/Procedures   Clinical Course as of Jul 08 1745  Mon Jul 09, 2019  1746 Consult: Reviewed with Dr. Jul 11, 2019 of orthopedics.  Request placement be placed in splint.  He will passed through the emergency department to evaluate the patient with anticipated scheduled outpatient surgery.   [MP]    Clinical Course User Index [MP] Victorino Dike, MD    Procedures SPLINT APPLICATION Date/Time: 6:07 PM Authorized by: Arby Barrette Consent: Verbal consent obtained. Risks and benefits: risks, benefits and alternatives were discussed Consent given by: patient Splint applied by: orthopedic technician with my assistance for positioning Location details: right upper arm Splint type: coaptation Supplies used: webril, orthoglass, ace. sling Post-procedure: The splinted body part was neurovascularly unchanged following the procedure. Strong radial pulse, good cap refill, hand stength intact Patient tolerance: Patient tolerated the procedure well with no immediate complications.   MDM        Arby Barrette, MD 07/09/19 09/08/19    Mallie Snooks, MD 07/11/19 [phone]

## 2019-07-09 NOTE — ED Triage Notes (Signed)
Pt reports her horse threw her off. She landed on right arm

## 2019-07-09 NOTE — ED Provider Notes (Signed)
[facility]-EMERGENCY DEPT Provider Note   CSN: [id number] Arrival date & time: 07/09/19  1611     History Chief Complaint  Patient presents with  . Arm Injury    Gina Fuentes is a 49 y.o. female.  49 yo F with a chief complaint of right upper arm pain.  Patient was riding a horse and fell off.  Landed on her right arm.  Complaining of pain to the right upper arm.  Denies other injury denies head injury denies loss consciousness denies chest pain abdominal pain lower extremity pain.  Denies pain to the hand wrist or forearm.  The history is provided by the patient.  Arm Injury Location:  Arm Arm location:  R arm and R upper arm Injury: yes   Time since incident:  2 hours Mechanism of injury: fall   Fall:    Fall occurred:  From an Educational psychologist (horse)   Impact surface:  Grass   Point of impact: shoulder.   Entrapped after fall: no   Pain details:    Quality:  Shooting and sharp   Radiates to:  Does not radiate   Severity:  Moderate   Onset quality:  Gradual   Duration:  2 hours   Timing:  Constant   Progression:  Worsening Handedness:  Right-handed Dislocation: no   Foreign body present:  No foreign bodies Prior injury to area:  No Relieved by:  Nothing Worsened by:  Bearing weight, exercise, movement, stress and stretching area Ineffective treatments:  None tried Associated symptoms: no fever        Past Medical History:  Diagnosis Date  . Depression   . Hypertension     Patient Active Problem List   Diagnosis Date Noted  . Fracture of tibia and fibula, shaft 02/04/2015    Past Surgical History:  Procedure Laterality Date  . ARTHRODESIS METATARSALPHALANGEAL JOINT (MTPJ) Right 11/16/2018   Procedure: Right hallux metatarsal phalangeal joint arthrodesis;  Surgeon: Toni Arthurs, MD;  Location: [facility];  Service: Orthopedics;  Laterality: Right;   . AUGMENTATION MAMMAPLASTY Bilateral   . BREAST SURGERY    .  MOUTH SURGERY    . TIBIA IM NAIL INSERTION Left 02/04/2015   Procedure: INTRAMEDULLARY (IM) NAIL TIBIAL;  Surgeon: Nadara Mustard, MD;  Location: [facility] OR;  Service: Orthopedics;  Laterality: Left;  . TONSILLECTOMY       OB History   No obstetric history on file.     No family history on file.  Social History   Tobacco Use  . Smoking status: Current Some Day Smoker    Packs/day: 0.50  . Smokeless tobacco: Never Used  Substance Use Topics  . Alcohol use: Yes    Alcohol/week: 10.0 standard drinks    Types: 10 Cans of beer per week  . Drug use: No    Home Medications Prior to Admission medications   Medication Sig Start Date End Date Taking? Authorizing Provider  aspirin EC 325 MG tablet Take 975 mg by mouth every 6 (six) hours as needed (pain).    [provider]  aspirin EC 325 MG tablet Take 1 tablet (325 mg total) by mouth daily. 02/05/15   Nadara Mustard, MD  Aspirin-Acetaminophen-Caffeine (GOODY HEADACHE PO) Take 2 packets by mouth 2 (two) times daily as needed (pain).    [provider]  docusate sodium (COLACE) 100 MG capsule Take 1 capsule (100 mg total) by mouth 2 (two) times daily. While taking narcotic  pain medicine. 11/16/18   Jacinta Shoe, PA-C  FLUoxetine (PROZAC) 20 MG capsule Take 80 mg by mouth at bedtime.    [provider]  levonorgestrel (MIRENA) 20 MCG/24HR IUD 1 each by Intrauterine route once. Implanted June 2016    [provider]  lisinopril (ZESTRIL) 10 MG tablet Take 10 mg by mouth daily.    [provider]  morphine (MSIR) 15 MG tablet Take 0.5 tablets (7.5 mg total) by mouth every 4 (four) hours as needed for severe pain. 07/09/19   Melene Plan, DO  senna (SENOKOT) 8.6 MG TABS tablet Take 2 tablets (17.2 mg total) by mouth 2 (two) times daily. 11/16/18   Jacinta Shoe, PA-C  varenicline (CHANTIX PAK) 0.5 MG X 11 & 1 MG X 42 tablet Take 2 (two) times daily by mouth. Take one 0.5 mg tablet by mouth once daily  for 3 days, then increase to one 0.5 mg tablet twice daily for 4 days, then increase to one 1 mg tablet twice daily.    [provider]    Allergies    Penicillins  Review of Systems   Review of Systems  Constitutional: Negative for chills and fever.  HENT: Negative for congestion and rhinorrhea.   Eyes: Negative for redness and visual disturbance.  Respiratory: Negative for shortness of breath and wheezing.   Cardiovascular: Negative for chest pain and palpitations.  Gastrointestinal: Negative for nausea and vomiting.  Genitourinary: Negative for dysuria and urgency.  Musculoskeletal: Positive for arthralgias and myalgias.  Skin: Negative for pallor and wound.  Neurological: Negative for dizziness and headaches.    Physical Exam Updated Vital Signs BP (!) 135/99 (BP Location: Left Arm)   Pulse 66   Resp 20   Ht 5\' 1"  (1.549 m)   Wt 56.7 kg   SpO2 100%   BMI 23.62 kg/m   Physical Exam Vitals and nursing note reviewed.  Constitutional:      General: She is not in acute distress.    Appearance: She is well-developed. She is not diaphoretic.  HENT:     Head: Normocephalic and atraumatic.  Eyes:     Pupils: Pupils are equal, round, and reactive to light.  Cardiovascular:     Rate and Rhythm: Normal rate and regular rhythm.     Heart sounds: No murmur. No friction rub. No gallop.   Pulmonary:     Effort: Pulmonary effort is normal.     Breath sounds: No wheezing or rales.  Abdominal:     General: There is no distension.     Palpations: Abdomen is soft.     Tenderness: There is no abdominal tenderness.  Musculoskeletal:        General: Swelling and tenderness present.     Cervical back: Normal range of motion and neck supple.     Comments: Pain to the mid humerus.  No obvious pain along the clavicle or the head of the humerus.  No pain along the scapula.  Full range of motion of the elbow.  No bony tenderness along the forearm.  Pulse motor and sensation intact  distally.  Skin:    General: Skin is warm and dry.  Neurological:     Mental Status: She is alert and oriented to person, place, and time.  Psychiatric:        Behavior: Behavior normal.     ED Results / Procedures / Treatments   Labs (all labs ordered are listed, but only abnormal results are  displayed) Labs Reviewed - No data to display  EKG None  Radiology DG Humerus Right  Result Date: 07/09/2019 CLINICAL DATA:  Pt reports her horse threw her off today. She landed on right arm. Severe right humeral pain at midshaft. EXAM: RIGHT HUMERUS - 2+ VIEW COMPARISON:  None. FINDINGS: There is a displaced transverse fracture through the mid right humerus. Displacement on lateral view measures approximately 2 cm. At no evidence of dislocation at the shoulder or elbow joints. Regional soft tissues unremarkable. There is a possible tiny displaced bone fragment along the lateral condyle of the elbow. IMPRESSION: 1.  Displaced fracture through the mid right humerus. 2. Possible tiny displaced fragment along the lateral condyle of the elbow. Recommend dedicated radiographs of the right elbow. Electronically Signed   By: Audie Pinto M.D.   On: 07/09/2019 17:09    Procedures Procedures (including critical care time)  Medications Ordered in ED Medications  fentaNYL (SUBLIMAZE) injection 100 mcg (100 mcg Intramuscular Given 07/09/19 1629)    ED Course  I have reviewed the triage vital signs and the nursing notes.  Pertinent labs & imaging results that were available during my care of the patient were reviewed by me and considered in my medical decision making (see chart for details).    MDM Rules/Calculators/A&P                      49 yo F with a chief complaints of right upper arm pain.  Patient was thrown off of a horse and landed on that arm.  Denies any other specific injury.  Will obtain a plain film of the right humerus.   Midshaft fx.  Signed out to Dr. Johnney Killian, please see her  note for further details of care in the ED>   The patients results and plan were reviewed and discussed.   Any x-rays performed were independently reviewed by myself.   Differential diagnosis were considered with the presenting HPI.  Medications  fentaNYL (SUBLIMAZE) injection 100 mcg (100 mcg Intramuscular Given 07/09/19 1629)    Vitals:   07/09/19 1617 07/09/19 1637  BP:  (!) 135/99  Pulse:  66  Resp:  20  SpO2:  100%  Weight: 56.7 kg   Height: 5\' 1"  (1.549 m)     Final diagnoses:  Closed displaced transverse fracture of shaft of right humerus, initial encounter      Final Clinical Impression(s) / ED Diagnoses Final diagnoses:  Closed displaced transverse fracture of shaft of right humerus, initial encounter    Rx / DC Orders ED Discharge Orders         Ordered    morphine (MSIR) 15 MG tablet  Every 4 hours PRN     07/09/19 Bristow, [place], DO 07/09/19 1714

## 2019-07-17 ENCOUNTER — Other Ambulatory Visit (HOSPITAL_COMMUNITY)
Admission: RE | Admit: 2019-07-17 | Discharge: 2019-07-17 | Disposition: A | Discharge status: Home / Self Care | Payer: Medicaid Other | Source: Ambulatory Visit | Attending: Orthopedic Surgery | Admitting: Orthopedic Surgery

## 2019-07-17 DIAGNOSIS — Z01812 Encounter for preprocedural laboratory examination: Secondary | ICD-10-CM | POA: Insufficient documentation

## 2019-07-17 DIAGNOSIS — Z20822 Contact with and (suspected) exposure to covid-19: Secondary | ICD-10-CM | POA: Diagnosis not present

## 2019-07-17 LAB — SARS CORONAVIRUS 2 (TAT 6-24 HRS): SARS Coronavirus 2: NEGATIVE

## 2019-07-18 ENCOUNTER — Encounter (HOSPITAL_COMMUNITY): Payer: Self-pay | Admitting: Orthopedic Surgery

## 2019-07-18 ENCOUNTER — Other Ambulatory Visit: Payer: Self-pay

## 2019-07-18 NOTE — Progress Notes (Signed)
Patient denies shortness of breath, fever, cough and chest pain.  PCP -  [facility] Cardiologist - denies  Chest x-ray - denies EKG - 11/13/18 Stress Test - denies ECHO - denies Cardiac Cath - denies  Aspirin Instructions: Last dose 07/10/19 per patient.  ERAS: Clears til 9:30 am DOS, no drink.  Anesthesia review: No  STOP now taking any Aspirin (unless otherwise instructed by your surgeon), Aleve, Naproxen, Ibuprofen, Motrin, Advil, Goody's, BC's, all herbal medications, fish oil, and all vitamins.   Coronavirus Screening Covid test on 07/17/19 was negative.  Patient verbalized understanding of instructions that were given via phone.

## 2019-07-19 ENCOUNTER — Ambulatory Visit (HOSPITAL_COMMUNITY): Payer: No Typology Code available for payment source

## 2019-07-19 ENCOUNTER — Encounter (HOSPITAL_COMMUNITY)
Admission: RE | Disposition: A | Discharge status: Home / Self Care | Payer: Self-pay | Source: Home / Self Care | Attending: Orthopedic Surgery

## 2019-07-19 ENCOUNTER — Encounter (HOSPITAL_COMMUNITY): Payer: Self-pay | Admitting: Orthopedic Surgery

## 2019-07-19 ENCOUNTER — Ambulatory Visit (HOSPITAL_COMMUNITY)
Admission: RE | Admit: 2019-07-19 | Discharge: 2019-07-19 | Disposition: A | Discharge status: Home / Self Care | Payer: No Typology Code available for payment source | Attending: Orthopedic Surgery | Admitting: Orthopedic Surgery

## 2019-07-19 ENCOUNTER — Ambulatory Visit (HOSPITAL_COMMUNITY): Payer: No Typology Code available for payment source | Admitting: Certified Registered"

## 2019-07-19 ENCOUNTER — Other Ambulatory Visit: Payer: Self-pay

## 2019-07-19 DIAGNOSIS — Z791 Long term (current) use of non-steroidal anti-inflammatories (NSAID): Secondary | ICD-10-CM | POA: Insufficient documentation

## 2019-07-19 DIAGNOSIS — F329 Major depressive disorder, single episode, unspecified: Secondary | ICD-10-CM | POA: Diagnosis not present

## 2019-07-19 DIAGNOSIS — Z79899 Other long term (current) drug therapy: Secondary | ICD-10-CM | POA: Diagnosis not present

## 2019-07-19 DIAGNOSIS — Z880 Allergy status to penicillin: Secondary | ICD-10-CM | POA: Diagnosis not present

## 2019-07-19 DIAGNOSIS — Z7982 Long term (current) use of aspirin: Secondary | ICD-10-CM | POA: Diagnosis not present

## 2019-07-19 DIAGNOSIS — F17210 Nicotine dependence, cigarettes, uncomplicated: Secondary | ICD-10-CM | POA: Insufficient documentation

## 2019-07-19 DIAGNOSIS — Z793 Long term (current) use of hormonal contraceptives: Secondary | ICD-10-CM | POA: Insufficient documentation

## 2019-07-19 DIAGNOSIS — I10 Essential (primary) hypertension: Secondary | ICD-10-CM | POA: Diagnosis not present

## 2019-07-19 DIAGNOSIS — Z419 Encounter for procedure for purposes other than remedying health state, unspecified: Secondary | ICD-10-CM

## 2019-07-19 DIAGNOSIS — Y939 Activity, unspecified: Secondary | ICD-10-CM | POA: Diagnosis not present

## 2019-07-19 DIAGNOSIS — S42321A Displaced transverse fracture of shaft of humerus, right arm, initial encounter for closed fracture: Secondary | ICD-10-CM | POA: Diagnosis not present

## 2019-07-19 DIAGNOSIS — S42301A Unspecified fracture of shaft of humerus, right arm, initial encounter for closed fracture: Secondary | ICD-10-CM | POA: Diagnosis present

## 2019-07-19 HISTORY — PX: ORIF HUMERUS FRACTURE: SHX2126

## 2019-07-19 LAB — COMPREHENSIVE METABOLIC PANEL
ALT: 21 U/L (ref 0–44)
AST: 27 U/L (ref 15–41)
Albumin: 4.2 g/dL (ref 3.5–5.0)
Alkaline Phosphatase: 51 U/L (ref 38–126)
Anion gap: 12 (ref 5–15)
BUN: 12 mg/dL (ref 6–20)
CO2: 23 mmol/L (ref 22–32)
Calcium: 10 mg/dL (ref 8.9–10.3)
Chloride: 104 mmol/L (ref 98–111)
Creatinine, Ser: 0.47 mg/dL (ref 0.44–1.00)
GFR calc Af Amer: >60 mL/min (ref >60)
GFR calc non Af Amer: >60 mL/min (ref >60)
Glucose, Bld: 105 mg/dL — ABNORMAL HIGH (ref 70–99)
Potassium: 4.1 mmol/L (ref 3.5–5.1)
Sodium: 139 mmol/L (ref 135–145)
Total Bilirubin: 1.4 mg/dL — ABNORMAL HIGH (ref 0.3–1.2)
Total Protein: 7.3 g/dL (ref 6.5–8.1)

## 2019-07-19 LAB — CBC
HCT: 42.2 % (ref 36.0–46.0)
Hemoglobin: 14 g/dL (ref 12.0–15.0)
MCH: 33.7 pg (ref 26.0–34.0)
MCHC: 33.2 g/dL (ref 30.0–36.0)
MCV: 101.7 fL — ABNORMAL HIGH (ref 80.0–100.0)
Platelets: 419 10*3/uL — ABNORMAL HIGH (ref 150–400)
RBC: 4.15 MIL/uL (ref 3.87–5.11)
RDW: 12.2 % (ref 11.5–15.5)
WBC: 5.2 10*3/uL (ref 4.0–10.5)
nRBC: 0 % (ref 0.0–0.2)

## 2019-07-19 LAB — POCT PREGNANCY, URINE: Preg Test, Ur: NEGATIVE

## 2019-07-19 SURGERY — OPEN REDUCTION INTERNAL FIXATION (ORIF) HUMERAL SHAFT FRACTURE
Anesthesia: General | Laterality: Right

## 2019-07-19 MED ORDER — FENTANYL CITRATE (PF) 250 MCG/5ML IJ SOLN
INTRAMUSCULAR | Status: AC
  Filled 2019-07-19: qty 5

## 2019-07-19 MED ORDER — ROCURONIUM BROMIDE 50 MG/5ML IV SOSY
PREFILLED_SYRINGE | INTRAVENOUS | Status: DC | PRN
  Administered 2019-07-19 (×2): 50 mg via INTRAVENOUS

## 2019-07-19 MED ORDER — SUCCINYLCHOLINE CHLORIDE 200 MG/10ML IV SOSY
PREFILLED_SYRINGE | INTRAVENOUS | Status: AC
  Filled 2019-07-19: qty 10

## 2019-07-19 MED ORDER — FENTANYL CITRATE (PF) 100 MCG/2ML IJ SOLN
INTRAMUSCULAR | Status: DC | PRN
  Administered 2019-07-19 (×2): 50 ug via INTRAVENOUS
  Administered 2019-07-19: 150 ug via INTRAVENOUS
  Administered 2019-07-19: 50 ug via INTRAVENOUS
  Administered 2019-07-19: 100 ug via INTRAVENOUS
  Administered 2019-07-19 (×2): 50 ug via INTRAVENOUS

## 2019-07-19 MED ORDER — KETOROLAC TROMETHAMINE 30 MG/ML IJ SOLN
INTRAMUSCULAR | Status: AC
  Filled 2019-07-19: qty 1

## 2019-07-19 MED ORDER — ACETAMINOPHEN 500 MG PO TABS: 1000.0000 mg | ORAL_TABLET | Freq: Once | ORAL | Status: AC

## 2019-07-19 MED ORDER — LIDOCAINE 2% (20 MG/ML) 5 ML SYRINGE
INTRAMUSCULAR | Status: AC
  Filled 2019-07-19: qty 5

## 2019-07-19 MED ORDER — BUPIVACAINE HCL (PF) 0.25 % IJ SOLN
INTRAMUSCULAR | Status: DC | PRN
  Administered 2019-07-19: 30 mL

## 2019-07-19 MED ORDER — OXYCODONE HCL 5 MG PO TABS: 5.0000 mg | ORAL_TABLET | ORAL | 0 refills | Status: AC | PRN

## 2019-07-19 MED ORDER — BUPIVACAINE HCL (PF) 0.25 % IJ SOLN
INTRAMUSCULAR | Status: AC
  Filled 2019-07-19: qty 30

## 2019-07-19 MED ORDER — SCOPOLAMINE 1 MG/3DAYS TD PT72
MEDICATED_PATCH | TRANSDERMAL | Status: AC
  Administered 2019-07-19: 1.5 mg via TRANSDERMAL
  Filled 2019-07-19: qty 1

## 2019-07-19 MED ORDER — DIPHENHYDRAMINE HCL 50 MG/ML IJ SOLN
12.5000 mg | Freq: Once | INTRAMUSCULAR | Status: AC
  Administered 2019-07-19: 12.5 mg via INTRAVENOUS

## 2019-07-19 MED ORDER — HYDROMORPHONE HCL 1 MG/ML IJ SOLN
INTRAMUSCULAR | Status: AC
  Filled 2019-07-19: qty 1

## 2019-07-19 MED ORDER — CHLORHEXIDINE GLUCONATE 4 % EX LIQD: 60.0000 mL | Freq: Once | CUTANEOUS | Status: DC

## 2019-07-19 MED ORDER — SCOPOLAMINE 1 MG/3DAYS TD PT72: 1.0000 | MEDICATED_PATCH | Freq: Once | TRANSDERMAL | Status: DC

## 2019-07-19 MED ORDER — MEPERIDINE HCL 25 MG/ML IJ SOLN: 6.2500 mg | INTRAMUSCULAR | Status: DC | PRN

## 2019-07-19 MED ORDER — MIDAZOLAM HCL 2 MG/2ML IJ SOLN: 0.5000 mg | Freq: Once | INTRAMUSCULAR | Status: DC | PRN

## 2019-07-19 MED ORDER — OXYCODONE HCL 5 MG PO TABS
ORAL_TABLET | ORAL | Status: AC
  Filled 2019-07-19: qty 1

## 2019-07-19 MED ORDER — OXYCODONE HCL 5 MG PO TABS
5.0000 mg | ORAL_TABLET | Freq: Once | ORAL | Status: AC
  Administered 2019-07-19: 5 mg via ORAL

## 2019-07-19 MED ORDER — KETOROLAC TROMETHAMINE 30 MG/ML IJ SOLN
INTRAMUSCULAR | Status: DC | PRN
  Administered 2019-07-19: 30 mg via INTRAVENOUS

## 2019-07-19 MED ORDER — TRANEXAMIC ACID-NACL 1000-0.7 MG/100ML-% IV SOLN
1000.0000 mg | INTRAVENOUS | Status: AC
  Administered 2019-07-19: 1000 mg via INTRAVENOUS

## 2019-07-19 MED ORDER — ACETAMINOPHEN 500 MG PO TABS
ORAL_TABLET | ORAL | Status: AC
  Administered 2019-07-19: 1000 mg via ORAL
  Filled 2019-07-19: qty 2

## 2019-07-19 MED ORDER — PROMETHAZINE HCL 25 MG/ML IJ SOLN: 6.2500 mg | INTRAMUSCULAR | Status: DC | PRN

## 2019-07-19 MED ORDER — FENTANYL CITRATE (PF) 100 MCG/2ML IJ SOLN
INTRAMUSCULAR | Status: AC
  Filled 2019-07-19: qty 2

## 2019-07-19 MED ORDER — MIDAZOLAM HCL 2 MG/2ML IJ SOLN
INTRAMUSCULAR | Status: AC
  Filled 2019-07-19: qty 2

## 2019-07-19 MED ORDER — TRANEXAMIC ACID-NACL 1000-0.7 MG/100ML-% IV SOLN
INTRAVENOUS | Status: AC
  Filled 2019-07-19: qty 100

## 2019-07-19 MED ORDER — CEFAZOLIN SODIUM-DEXTROSE 2-4 GM/100ML-% IV SOLN
INTRAVENOUS | Status: AC
  Filled 2019-07-19: qty 100

## 2019-07-19 MED ORDER — PROPOFOL 10 MG/ML IV BOLUS
INTRAVENOUS | Status: DC | PRN
  Administered 2019-07-19: 150 mg via INTRAVENOUS

## 2019-07-19 MED ORDER — MIDAZOLAM HCL 5 MG/5ML IJ SOLN
INTRAMUSCULAR | Status: DC | PRN
  Administered 2019-07-19: 2 mg via INTRAVENOUS

## 2019-07-19 MED ORDER — ONDANSETRON HCL 4 MG/2ML IJ SOLN
INTRAMUSCULAR | Status: DC | PRN
  Administered 2019-07-19: 4 mg via INTRAVENOUS

## 2019-07-19 MED ORDER — ONDANSETRON 4 MG PO TBDP: 4.0000 mg | ORAL_TABLET | Freq: Three times a day (TID) | ORAL | 0 refills | Status: DC | PRN

## 2019-07-19 MED ORDER — SUGAMMADEX SODIUM 200 MG/2ML IV SOLN
INTRAVENOUS | Status: DC | PRN
  Administered 2019-07-19: 110 mg via INTRAVENOUS

## 2019-07-19 MED ORDER — CEFAZOLIN SODIUM-DEXTROSE 2-4 GM/100ML-% IV SOLN
2.0000 g | INTRAVENOUS | Status: AC
  Administered 2019-07-19: 2 g via INTRAVENOUS

## 2019-07-19 MED ORDER — ONDANSETRON HCL 4 MG/2ML IJ SOLN
INTRAMUSCULAR | Status: AC
  Filled 2019-07-19: qty 2

## 2019-07-19 MED ORDER — DEXAMETHASONE SODIUM PHOSPHATE 10 MG/ML IJ SOLN
INTRAMUSCULAR | Status: DC | PRN
  Administered 2019-07-19: 10 mg via INTRAVENOUS

## 2019-07-19 MED ORDER — DIPHENHYDRAMINE HCL 50 MG/ML IJ SOLN
INTRAMUSCULAR | Status: AC
  Filled 2019-07-19: qty 1

## 2019-07-19 MED ORDER — LIDOCAINE 2% (20 MG/ML) 5 ML SYRINGE
INTRAMUSCULAR | Status: DC | PRN
  Administered 2019-07-19: 60 mg via INTRAVENOUS

## 2019-07-19 MED ORDER — PROPOFOL 10 MG/ML IV BOLUS
INTRAVENOUS | Status: AC
  Filled 2019-07-19: qty 20

## 2019-07-19 MED ORDER — LACTATED RINGERS IV SOLN: INTRAVENOUS | Status: DC

## 2019-07-19 MED ORDER — HYDROMORPHONE HCL 1 MG/ML IJ SOLN
0.2500 mg | INTRAMUSCULAR | Status: DC | PRN
  Administered 2019-07-19: 0.25 mg via INTRAVENOUS
  Administered 2019-07-19 (×2): 0.5 mg via INTRAVENOUS

## 2019-07-19 MED ORDER — DEXAMETHASONE SODIUM PHOSPHATE 10 MG/ML IJ SOLN
INTRAMUSCULAR | Status: AC
  Filled 2019-07-19: qty 1

## 2019-07-19 SURGICAL SUPPLY — 60 items
BIT DRILL QC 3.5 150 (BIT) IMPLANT
BIT DRILL QC 3.5 150MM (BIT)
BIT DRILL QC SFS 2.5X170 (BIT) ×2 IMPLANT
BNDG ELASTIC 4X5.8 VLCR STR LF (GAUZE/BANDAGES/DRESSINGS) ×2 IMPLANT
CLOSURE WOUND 1/2 X4 (GAUZE/BANDAGES/DRESSINGS)
COVER SURGICAL LIGHT HANDLE (MISCELLANEOUS) ×3 IMPLANT
COVER WAND RF STERILE (DRAPES) ×3 IMPLANT
DRAPE INCISE IOBAN 66X45 STRL (DRAPES) ×3 IMPLANT
DRAPE ORTHO SPLIT 77X108 STRL (DRAPES) ×6
DRAPE SURG ORHT 6 SPLT 77X108 (DRAPES) ×2 IMPLANT
DRAPE U-SHAPE 47X51 STRL (DRAPES) ×3 IMPLANT
DRILL BIT 3.5 ×2 IMPLANT
DRSG AQUACEL AG ADV 3.5X 4 (GAUZE/BANDAGES/DRESSINGS) IMPLANT
DRSG AQUACEL AG ADV 3.5X 6 (GAUZE/BANDAGES/DRESSINGS) IMPLANT
DRSG PAD ABDOMINAL 8X10 ST (GAUZE/BANDAGES/DRESSINGS) ×3 IMPLANT
DURAPREP 26ML APPLICATOR (WOUND CARE) ×3 IMPLANT
ELECT REM PT RETURN 9FT ADLT (ELECTROSURGICAL) ×3
ELECTRODE REM PT RTRN 9FT ADLT (ELECTROSURGICAL) ×1 IMPLANT
GAUZE SPONGE 4X4 12PLY STRL (GAUZE/BANDAGES/DRESSINGS) ×2 IMPLANT
GAUZE XEROFORM 5X9 LF (GAUZE/BANDAGES/DRESSINGS) ×2 IMPLANT
GLOVE BIO SURGEON STRL SZ7.5 (GLOVE) ×3 IMPLANT
GLOVE BIOGEL PI IND STRL 8 (GLOVE) ×1 IMPLANT
GLOVE BIOGEL PI INDICATOR 8 (GLOVE) ×2
GOWN STRL REUS W/ TWL LRG LVL3 (GOWN DISPOSABLE) ×1 IMPLANT
GOWN STRL REUS W/ TWL XL LVL3 (GOWN DISPOSABLE) ×1 IMPLANT
GOWN STRL REUS W/TWL LRG LVL3 (GOWN DISPOSABLE) ×3
GOWN STRL REUS W/TWL XL LVL3 (GOWN DISPOSABLE) ×3
KIT BASIN OR (CUSTOM PROCEDURE TRAY) ×3 IMPLANT
KIT TURNOVER KIT B (KITS) ×3 IMPLANT
MANIFOLD NEPTUNE II (INSTRUMENTS) ×3 IMPLANT
NS IRRIG 1000ML POUR BTL (IV SOLUTION) ×3 IMPLANT
PACK SHOULDER (CUSTOM PROCEDURE TRAY) ×3 IMPLANT
PAD ARMBOARD 7.5X6 YLW CONV (MISCELLANEOUS) ×6 IMPLANT
PROS LCP PLATE 9H 124M (Plate) ×3 IMPLANT
PROSTHESIS LCP PLATE 9H 124M (Plate) IMPLANT
SCREW CORT HEADED ST 3.5X24 (Screw) ×2 IMPLANT
SCREW CORT LP ST 3.5X22 (Screw) IMPLANT
SCREW CORTEX 3.5 20MM (Screw) ×6 IMPLANT
SCREW CORTEX 3.5 22MM (Screw) ×10 IMPLANT
SCREW LOCK CORT ST 3.5X20 (Screw) IMPLANT
SCREW LOCK CORT ST 3.5X22 (Screw) IMPLANT
SLING ARM FOAM STRAP LRG (SOFTGOODS) ×2 IMPLANT
SLING ARM FOAM STRAP MED (SOFTGOODS) IMPLANT
SPONGE LAP 18X18 RF (DISPOSABLE) IMPLANT
SPONGE LAP 4X18 RFD (DISPOSABLE) IMPLANT
STAPLER VISISTAT 35W (STAPLE) ×2 IMPLANT
STRIP CLOSURE SKIN 1/2X4 (GAUZE/BANDAGES/DRESSINGS) IMPLANT
SUCTION FRAZIER HANDLE 10FR (MISCELLANEOUS) ×3
SUCTION TUBE FRAZIER 10FR DISP (MISCELLANEOUS) ×1 IMPLANT
SUT FIBERWIRE #2 38 T-5 BLUE (SUTURE)
SUT MNCRL AB 4-0 PS2 18 (SUTURE) IMPLANT
SUT VIC AB 2-0 CT1 27 (SUTURE) ×6
SUT VIC AB 2-0 CT1 TAPERPNT 27 (SUTURE) ×1 IMPLANT
SUTURE FIBERWR #2 38 T-5 BLUE (SUTURE) IMPLANT
SYR 10ML LL (SYRINGE) ×2 IMPLANT
SYR CONTROL 10ML LL (SYRINGE) IMPLANT
TOWEL GREEN STERILE (TOWEL DISPOSABLE) ×3 IMPLANT
TOWEL GREEN STERILE FF (TOWEL DISPOSABLE) ×3 IMPLANT
WATER STERILE IRR 1000ML POUR (IV SOLUTION) ×3 IMPLANT
YANKAUER SUCT BULB TIP NO VENT (SUCTIONS) ×3 IMPLANT

## 2019-07-19 NOTE — Discharge Instructions (Signed)
-  Maintain postoperative Ace bandage for 2 days.  He may remove this on the third day.  The deep dressing should maintain unless it has saturated.  As long as that dressing is intact he can maintain this for 2 weeks until your follow-up appointment.  You may shower with that occlusive, yellow dressing in place.  -Apply ice to the right arm for 30 minutes/h that you are awake each day.  Try to perform 3-4 ice sessions per day.  -For mild to moderate pain use Tylenol and Advil around-the-clock.  For breakthrough pain use oxycodone as needed.  -No lifting with the right arm.  -You should maintain your sling for comfort for the first 2 weeks.  -Okay to move your elbow, hand, and wrist as tolerated.  Please do not move through the shoulder.  Okay for pendulums and scapular retractions.  -Follow-up with Dr. Aundria Rud in 2 weeks for routine postop care.

## 2019-07-19 NOTE — Brief Op Note (Addendum)
07/19/2019  2:17 PM  PATIENT:  Gina Fuentes  49 y.o. female  PRE-OPERATIVE DIAGNOSIS:  Right humerus fracture  POST-OPERATIVE DIAGNOSIS:  Right humerus fracture  PROCEDURE:  Procedure(s) with comments: OPEN REDUCTION INTERNAL FIXATION (ORIF) HUMERAL SHAFT FRACTURE (Right) - 2 hrs RNFA  SURGEON:  Surgeon(s) and Role:    * Yolonda Kida, MD - Primary  PHYSICIAN ASSISTANT:   ASSISTANTS: April Green, RNFA   ANESTHESIA:   local and general  EBL:   20 cc  BLOOD ADMINISTERED:none  DRAINS: none   LOCAL MEDICATIONS USED:  MARCAINE     SPECIMEN:  No Specimen  DISPOSITION OF SPECIMEN:  N/A  COUNTS:  YES  TOURNIQUET:  * No tourniquets in log *  DICTATION: .Note written in EPIC  PLAN OF CARE: Discharge to home after PACU  PATIENT DISPOSITION:  PACU - hemodynamically stable.   Delay start of Pharmacological VTE agent (>24hrs) due to surgical blood loss or risk of bleeding: not applicable

## 2019-07-19 NOTE — Anesthesia Postprocedure Evaluation (Signed)
Anesthesia Post Note  Patient: Gina Fuentes  Procedure(s) Performed: OPEN REDUCTION INTERNAL FIXATION (ORIF) HUMERAL SHAFT FRACTURE (Right )     Patient location during evaluation: PACU Anesthesia Type: General Level of consciousness: awake and alert, patient cooperative and oriented Pain management: pain level controlled Vital Signs Assessment: post-procedure vital signs reviewed and stable Respiratory status: spontaneous breathing, nonlabored ventilation and respiratory function stable Cardiovascular status: blood pressure returned to baseline and stable Postop Assessment: no apparent nausea or vomiting Anesthetic complications: no    Last Vitals:  Vitals:   07/19/19 1505 07/19/19 1520  BP: 133/88 140/90  Pulse: 69 74  Resp: 12 19  Temp:  36.7 C  SpO2: 95% 95%    Last Pain:  Vitals:   07/19/19 1520  TempSrc:   PainSc: 3                  Bryttany Tortorelli,E. Elfrieda Espino

## 2019-07-19 NOTE — Anesthesia Preprocedure Evaluation (Addendum)
Anesthesia Evaluation  Patient identified by MRN, date of birth, ID band Patient awake    Reviewed: Allergy & Precautions, NPO status , Patient's Chart, lab work & pertinent test results  History of Anesthesia Complications Negative for: history of anesthetic complications  Airway Mallampati: I  TM Distance: >3 FB Neck ROM: Full    Dental  (+) Dental Advisory Given   Pulmonary Current Smoker and Patient abstained from smoking.,  07/17/2019 SARS coronavirus neg   breath sounds clear to auscultation       Cardiovascular hypertension, Pt. on medications (-) angina Rhythm:Regular Rate:Normal     Neuro/Psych negative neurological ROS     GI/Hepatic negative GI ROS, Neg liver ROS,   Endo/Other  negative endocrine ROS  Renal/GU negative Renal ROS     Musculoskeletal   Abdominal   Peds  Hematology negative hematology ROS (+)   Anesthesia Other Findings   Reproductive/Obstetrics                            Anesthesia Physical Anesthesia Plan  ASA: II  Anesthesia Plan: General   Post-op Pain Management:    Induction: Intravenous  PONV Risk Score and Plan: 3 and Ondansetron, Dexamethasone and Scopolamine patch - Pre-op  Airway Management Planned: Oral ETT  Additional Equipment:   Intra-op Plan:   Post-operative Plan: Extubation in OR  Informed Consent: I have reviewed the patients History and Physical, chart, labs and discussed the procedure including the risks, benefits and alternatives for the proposed anesthesia with the patient or authorized representative who has indicated his/her understanding and acceptance.     Dental advisory given  Plan Discussed with: CRNA and Surgeon  Anesthesia Plan Comments:         Anesthesia Quick Evaluation

## 2019-07-19 NOTE — Op Note (Signed)
Date of Surgery: 07/19/2019  INDICATIONS: Gina Fuentes is a right handed 48 y.o.-year-old female with a right closed humeral shaft fracture following a fall from a horse; she had continued pain and instability to the fracture site following 1 week in her splint.  She had a lengthy conversation with me regarding operative versus nonoperative treatment options.  She has elected for operative management.  The patient did consent to the procedure after discussion of the risks and benefits.  PREOPERATIVE DIAGNOSIS:  Closed right humerus fracture  POSTOPERATIVE DIAGNOSIS: Same.  PROCEDURE:  Open reduction internal fixation of right humerus fracture.  SURGEON: Maryan Rued, M.D.  ASSIST: April Chilton Si, RNFA.  ANESTHESIA:  general  IV FLUIDS AND URINE: See anesthesia.  ESTIMATED BLOOD LOSS: 20 mL.  IMPLANTS: Synthes 3.5 LCP plate with 4 proximal 3.5 millimeter screws and 4 distal 3.5 millimeter screws.  1 low-profile 3.5 mm interfragmentary screw  DRAINS: None  COMPLICATIONS: None.  DESCRIPTION OF PROCEDURE: The patient was brought to the operating room and placed supine on the operating table.  The patient had been signed prior to the procedure and this was documented. The patient had the anesthesia placed by the anesthesiologist.  A time-out was performed to confirm that this was the correct patient, site, side and location. The patient did receive antibiotics prior to the incision and was re-dosed during the procedure as needed at indicated intervals.  A tourniquet was not placed.  The patient had the operative extremity prepped and draped in the standard surgical fashion.     A anterior lateral approach was undertaken to the midshaft of the humerus.  Skin incision was made just lateral to the biceps brachialis muscle bellies.  This was extended proximally to the deltoid insertion and distally to the mobile wad of the brachial radialis.  We then opened up the biceps fascia and bluntly  dissected deep to the biceps and retracted this medially.  We then split the brachialis from the anterior surface of the humerus in line with its fibers bluntly to approach the fracture site.  We then cleared proximally and did elevate some of the anterior deltoid insertion and distally cleared between the musculocutaneous and radial nerve innervations of the brachialis.  Next, we identified the fracture site.  This was a oblique pattern with a component of transverse fracture at the distal most aspect.  We debrided soft tissue and hematoma from the fracture fragments.  We then anatomically aligned the fracture fragments and held this in place provisionally with a bone clamp.  Next a interfragmentary screw was placed from lateral to medial utilizing compression by technique.  This had excellent purchase.  We then selected a 9 hole 3.5 mm LCP Synthes stainless steel plate.  We utilized intraoperative fluoroscopy in the AP and lateral views to establish the correct position of the plate.  We then placed nonlocking screws distal and proximal to the fracture site.  The screws all had excellent purchase.  Following placement of all screws and are 9 hole plate as well as her interfragmentary screw we did obtained AP and lateral fluoroscopic images intraoperatively to confirm reduction and placement of hardware.  There were no noted complications.  Care was taken throughout the case to avoid unnecessary retraction of the musculocutaneous nerve medially or the radial nerve laterally.  Finally we copiously irrigated the wound.  We repaired the deltoid insertion back to the plate proximally.  We then began to close with 2-0 Vicryl for the deep dermal layer and  staples for the skin.  The arm was cleaned and dried 1 final time a standard sterile dressing was applied with a water occlusive bandage.  A sling was applied.  All counts were correct x2.  There were no noted intraoperative complications.  Patient was extubated  without complication.  She was transported to PACU in stable condition.  POSTOPERATIVE PLAN:  Gina Fuentes will be discharged home from PACU with her right arm in a sling.  She will be nonweightbearing.  She will be allowed for full elbow, hand, and wrist range of motion as tolerated.  No active motion through the shoulder for the first 2 weeks.  She is okay for pendulums and scapular retractions.  She will follow-up with me in 2 weeks.

## 2019-07-19 NOTE — Anesthesia Procedure Notes (Addendum)
Procedure Name: Intubation Date/Time: 07/19/2019 12:54 PM Performed by: Rosiland Oz, CRNA Pre-anesthesia Checklist: Patient identified, Emergency Drugs available, Suction available, Patient being monitored and Timeout performed Patient Re-evaluated:Patient Re-evaluated prior to induction Oxygen Delivery Method: Circle system utilized Preoxygenation: Pre-oxygenation with 100% oxygen Induction Type: IV induction Ventilation: Mask ventilation without difficulty Laryngoscope Size: Miller and 3 Grade View: Grade II Tube type: Oral Tube size: 7.0 mm Number of attempts: 1 Airway Equipment and Method: Stylet Placement Confirmation: ETT inserted through vocal cords under direct vision,  positive ETCO2 and breath sounds checked- equal and bilateral Secured at: 21 cm Tube secured with: Tape Dental Injury: Teeth and Oropharynx as per pre-operative assessment

## 2019-07-19 NOTE — Transfer of Care (Signed)
Immediate Anesthesia Transfer of Care Note  Patient: Gina Fuentes  Procedure(s) Performed: OPEN REDUCTION INTERNAL FIXATION (ORIF) HUMERAL SHAFT FRACTURE (Right )  Patient Location: PACU  Anesthesia Type:General  Level of Consciousness: drowsy and patient cooperative  Airway & Oxygen Therapy: Patient Spontanous Breathing  Post-op Assessment: Report given to RN and Post -op Vital signs reviewed and stable  Post vital signs: Reviewed and stable  Last Vitals:  Vitals Value Taken Time  BP 104/93 07/19/19 1426  Temp    Pulse 72 07/19/19 1429  Resp 12 07/19/19 1429  SpO2 96 % 07/19/19 1429  Vitals shown include unvalidated device data.  Last Pain:  Vitals:   07/19/19 1425  TempSrc:   PainSc: (P) 10-Worst pain ever      Patients Stated Pain Goal: (P) 3 (07/19/19 1425)  Complications: No apparent anesthesia complications

## 2019-07-19 NOTE — H&P (Signed)
ORTHOPAEDIC H and P  REQUESTING PHYSICIAN: Nicholes Stairs, MD  PCP:  Pennie Banter, NP  Chief Complaint: Right humerus fracture  HPI: Gina Fuentes is a 49 y.o. female who complains of increasing right arm pain following a fall from a horse.  She was seen at the emergency department just over a week ago where x-rays demonstrated a midshaft humerus fracture.  She is placed in a splint at that time.  She has had increasing pain and difficulty with ADLs.  She is elected for operative management.  We have previously discussed the indications for this procedure and she is here today with no new complaints.  Past Medical History:  Diagnosis Date  . Depression   . Hypertension    Past Surgical History:  Procedure Laterality Date  . ARTHRODESIS METATARSALPHALANGEAL JOINT (MTPJ) Right 11/16/2018   Procedure: Right hallux metatarsal phalangeal joint arthrodesis;  Surgeon: [name], MD;  Location: [place];  Service: Orthopedics;  Laterality: Right;  37min  . AUGMENTATION MAMMAPLASTY Bilateral   . BREAST SURGERY    . MOUTH SURGERY     gum graft  . TIBIA IM NAIL INSERTION Left 02/04/2015   Procedure: INTRAMEDULLARY (IM) NAIL TIBIAL;  Surgeon: Newt Minion, MD;  Location: [place];  Service: Orthopedics;  Laterality: Left;  . TONSILLECTOMY     Social History   Socioeconomic History  . Marital status: Single    Spouse name: Not on file  . Number of children: Not on file  . Years of education: Not on file  . Highest education level: Not on file  Occupational History  . Not on file  Tobacco Use  . Smoking status: Current Some Day Smoker    Packs/day: 0.50    Years: 25.00    Pack years: 12.50    Types: Cigarettes  . Smokeless tobacco: Never Used  Substance and Sexual Activity  . Alcohol use: Yes    Alcohol/week: 10.0 standard drinks    Types: 10 Cans of beer per week    Comment: Beer/Wine  . Drug use: No  . Sexual activity: Yes    Birth  control/protection: I.U.D.    Comment: Mirena 2020  Other Topics Concern  . Not on file  Social History Narrative  . Not on file   Social Determinants of Health   Financial Resource Strain:   . Difficulty of Paying Living Expenses:   Food Insecurity:   . Worried About Charity fundraiser in the Last Year:   . Arboriculturist in the Last Year:   Transportation Needs:   . Film/video editor (Medical):   Marland Kitchen Lack of Transportation (Non-Medical):   Physical Activity:   . Days of Exercise per Week:   . Minutes of Exercise per Session:   Stress:   . Feeling of Stress :   Social Connections:   . Frequency of Communication with Friends and Family:   . Frequency of Social Gatherings with Friends and Family:   . Attends Religious Services:   . Active Member of Clubs or Organizations:   . Attends Archivist Meetings:   Marland Kitchen Marital Status:    History reviewed. No pertinent family history. Allergies  Allergen Reactions  . Penicillins Swelling and Other (See Comments)    Childhood allergic reaction - pt was never told the reaction and does not recall whether or not she was hospitalized.  Did it involve swelling of the face/tongue/throat, SOB, or low BP?  Y Did it involve sudden or severe rash/hives, skin peeling, or any reaction on the inside of your mouth or nose? Y Did you need to seek medical attention at a hospital or doctor's office? UNK When did it last happen?Childhood If all above answers are "NO", may proceed with cephalosporin use.    Prior to Admission medications   Medication Sig Start Date End Date Taking? Authorizing Provider  aspirin EC 325 MG tablet Take 1 tablet (325 mg total) by mouth daily. 02/05/15  Yes Nadara Mustard, MD  levonorgestrel (MIRENA) 20 MCG/24HR IUD 1 each by Intrauterine route once. Implanted June 2016   Yes [provider]  lisinopril (ZESTRIL) 10 MG tablet Take 10 mg by mouth daily.   Yes [provider]  naproxen  (NAPROSYN) 500 MG tablet Take 1 tablet (500 mg total) by mouth 2 (two) times daily. 07/09/19  Yes Arby Barrette, MD  oxyCODONE-acetaminophen (PERCOCET) 5-325 MG tablet Take 1-2 tablets by mouth every 6 (six) hours as needed. 07/09/19  Yes Arby Barrette, MD  docusate sodium (COLACE) 100 MG capsule Take 1 capsule (100 mg total) by mouth 2 (two) times daily. While taking narcotic pain medicine. Patient not taking: Reported on 07/09/2019 11/16/18   Jacinta Shoe, PA-C  FLUoxetine (PROZAC) 40 MG capsule Take 40 mg by mouth daily.  06/29/19   [provider]  senna (SENOKOT) 8.6 MG TABS tablet Take 2 tablets (17.2 mg total) by mouth 2 (two) times daily. Patient not taking: Reported on 07/09/2019 11/16/18   Jacinta Shoe, PA-C   No results found.  Positive ROS: All other systems have been reviewed and were otherwise negative with the exception of those mentioned in the HPI and as above.  Physical Exam: General: Alert, no acute distress Cardiovascular: No pedal edema Respiratory: No cyanosis, no use of accessory musculature GI: No organomegaly, abdomen is soft and non-tender Skin: No lesions in the area of chief complaint Neurologic: Sensation intact distally Psychiatric: Patient is competent for consent with normal mood and affect Lymphatic: No axillary or cervical lymphadenopathy  MUSCULOSKELETAL:  Right upper extremity has some bruising along the distal brachium and hand and wrist.  No deficits with light touch.  Good 2+ radial pulse.  Assessment: 1.  Right closed humeral shaft fracture  Plan: -Together we reviewed the indications for open reduction and internal fixation of the humerus fracture.  She is planning for discharge home postoperatively.  We again reviewed the risk of bleeding, infection, damage to surrounding nerves and vessels, stiffness, nonunion, malunion, painful hardware, need for further surgery, and the risk of anesthesia.  -Planning for discharge home  postoperatively from PACU.    Yolonda Kida, MD Cell [phone]    07/19/2019 12:19 PM

## 2019-07-20 ENCOUNTER — Encounter: Payer: Self-pay | Admitting: *Deleted

## 2021-09-30 IMAGING — RF DG HUMERUS 2V *R*
1 series · 2 of 2 positions shown · IV contrast (agent unspecified)
Comparison: 07/09/2019

CLINICAL DATA: Right humeral ORIF

EXAM:
DG C-ARM 1-60 MIN; RIGHT HUMERUS - 2+ VIEW
CONTRAST:  None
FLUOROSCOPY TIME:  Fluoroscopy Time:  14 seconds
Radiation Exposure Index (if provided by the fluoroscopic device):
0.35 mGy
Number of Acquired Spot Images: 2

[Series 1: unknown protocol · right · 0.14mm/px · 2 of 2 slices shown]
[im 1/2]
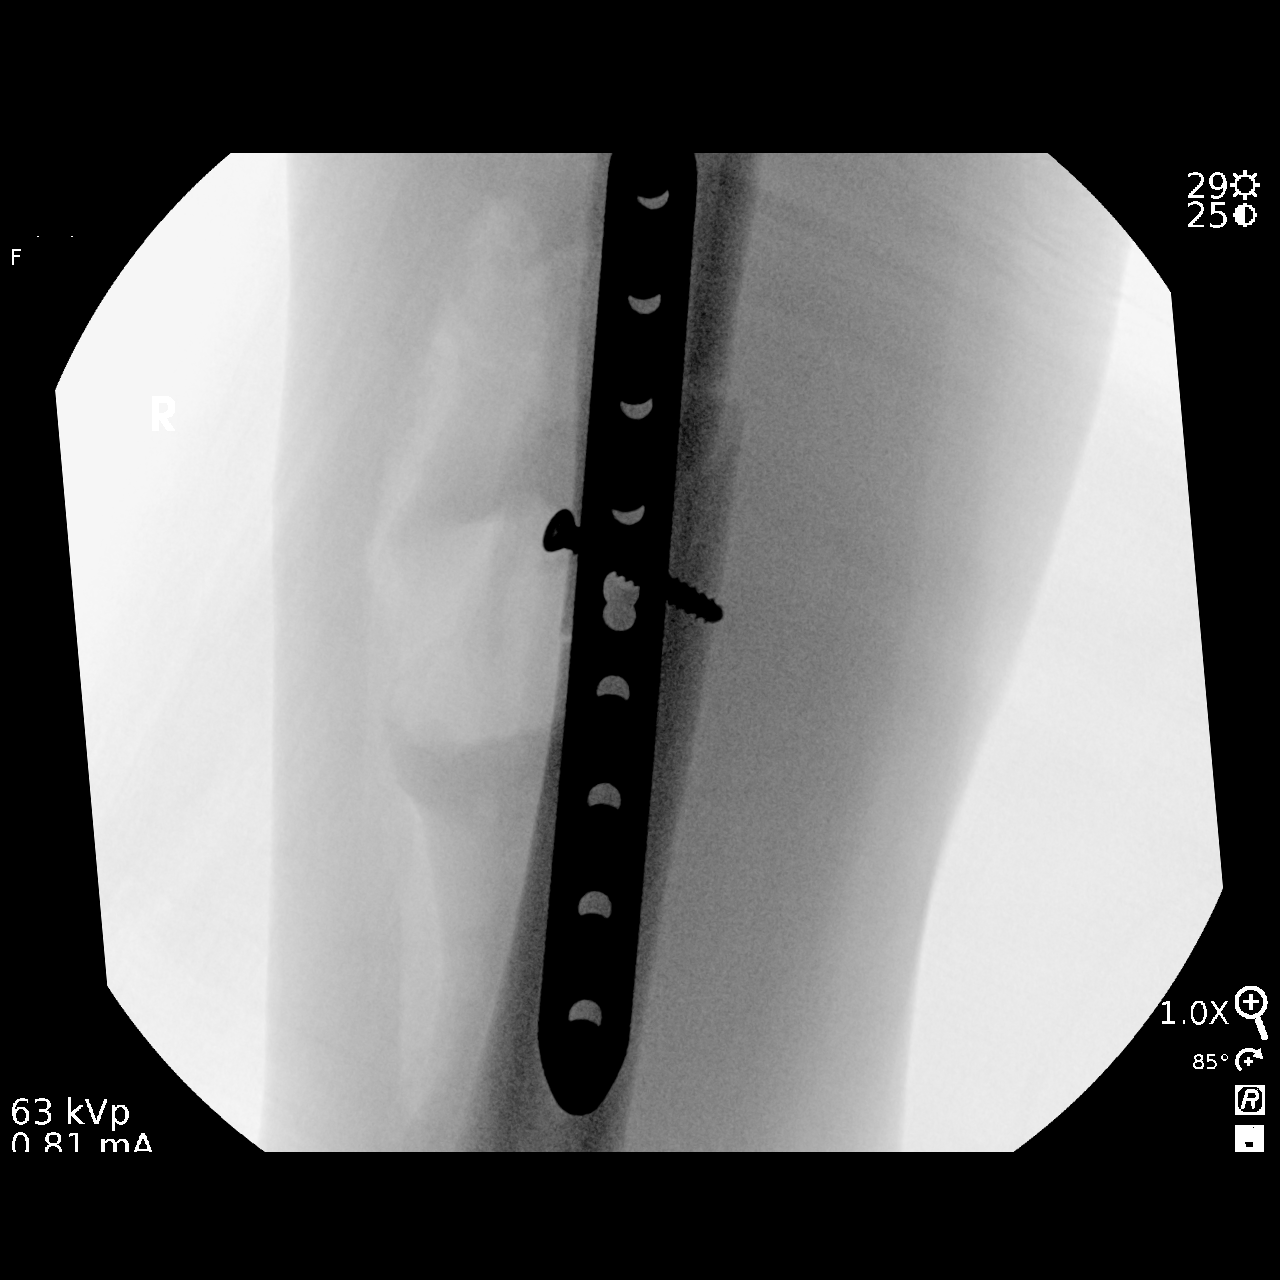
[im 2/2]
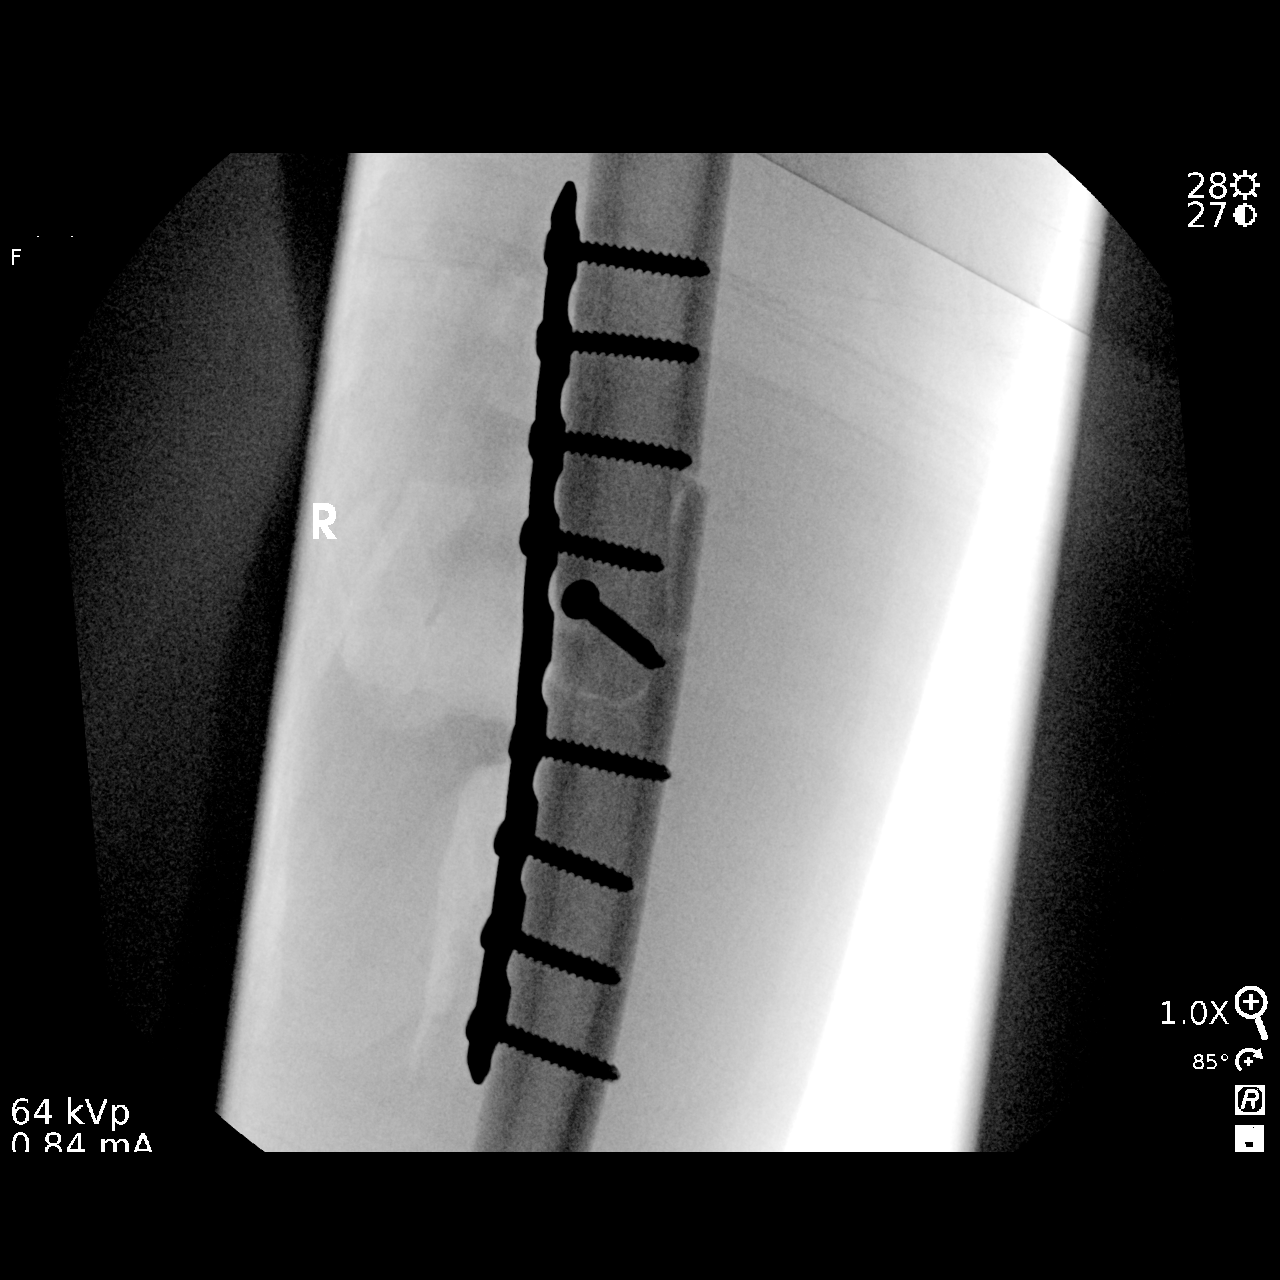

[2 of 2 positions shown; findings below may reference images not displayed]

FINDINGS: 2 C-arm fluoroscopic images were obtained intraoperatively and
submitted for post operative interpretation. Sideplate and screw
fixation construct traverse mid humeral diaphyseal fracture.
Fracture alignment is near anatomic. Air within the overlying soft
tissues noted. 14 seconds of fluoroscopy time was utilized. Please
see the performing provider's procedural report for further detail.
IMPRESSION: As above.

## 2021-09-30 IMAGING — RF DG C-ARM 1-60 MIN
1 series · 2 of 2 positions shown · IV contrast (agent unspecified)
Comparison: 07/09/2019

CLINICAL DATA: Right humeral ORIF

EXAM:
DG C-ARM 1-60 MIN; RIGHT HUMERUS - 2+ VIEW
CONTRAST:  None
FLUOROSCOPY TIME:  Fluoroscopy Time:  14 seconds
Radiation Exposure Index (if provided by the fluoroscopic device):
0.35 mGy
Number of Acquired Spot Images: 2

[Series 1: unknown protocol · right · 0.14mm/px · 2 of 2 slices shown]
[im 1/2]
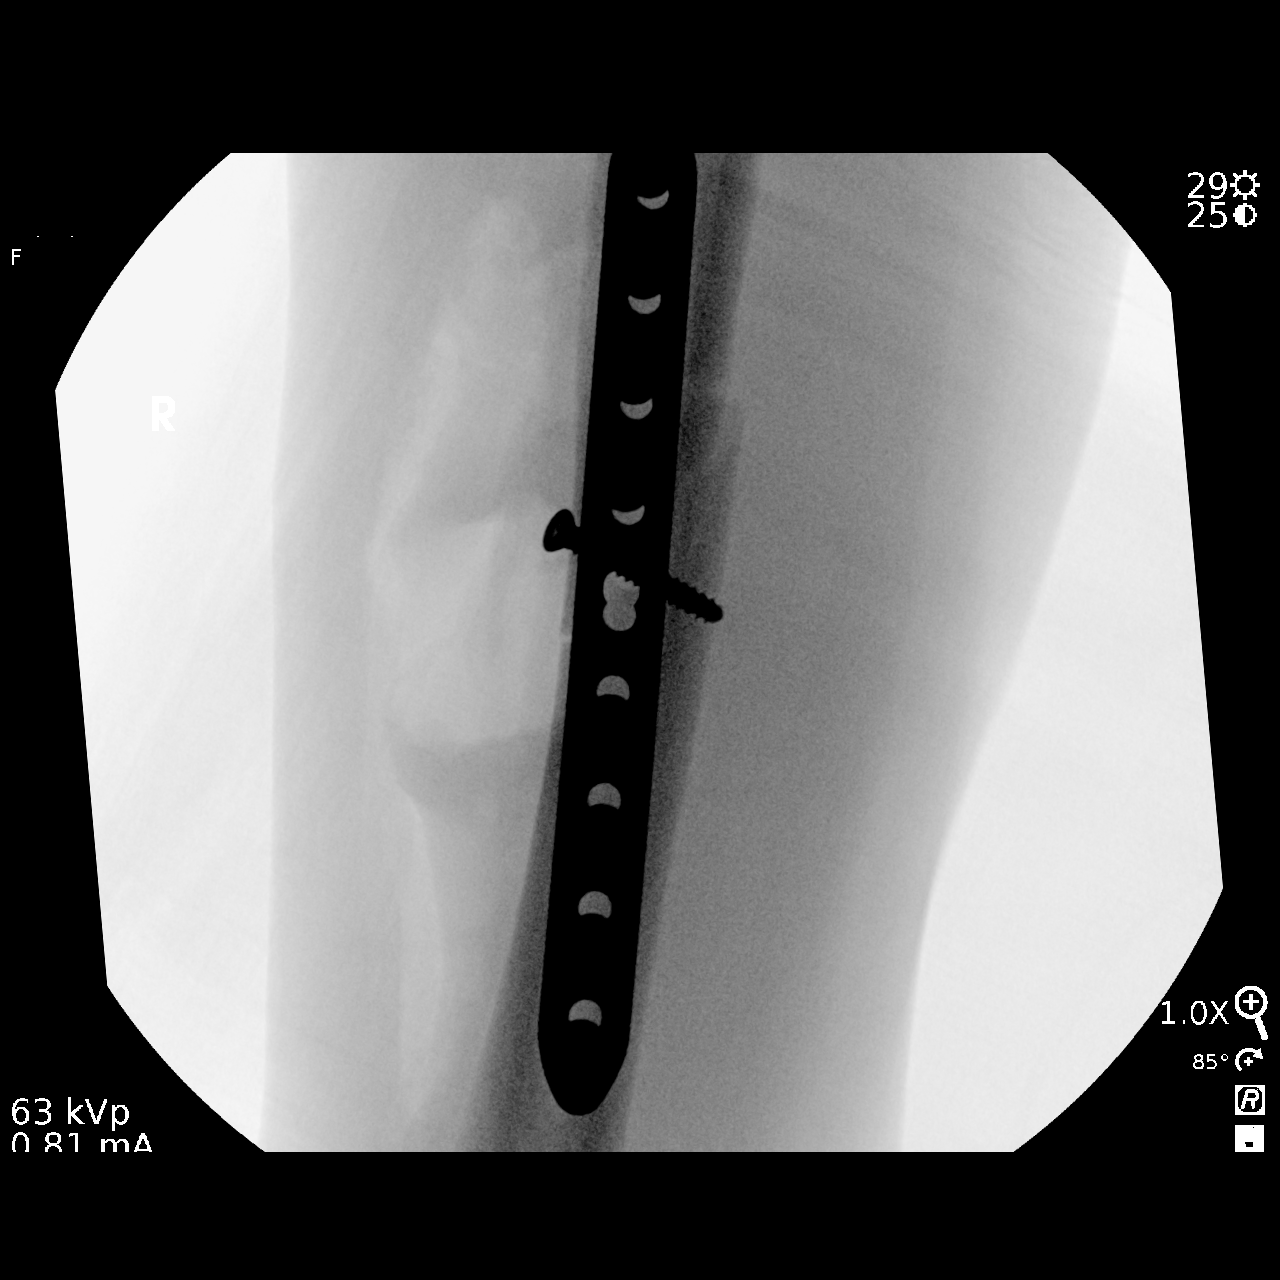
[im 2/2]
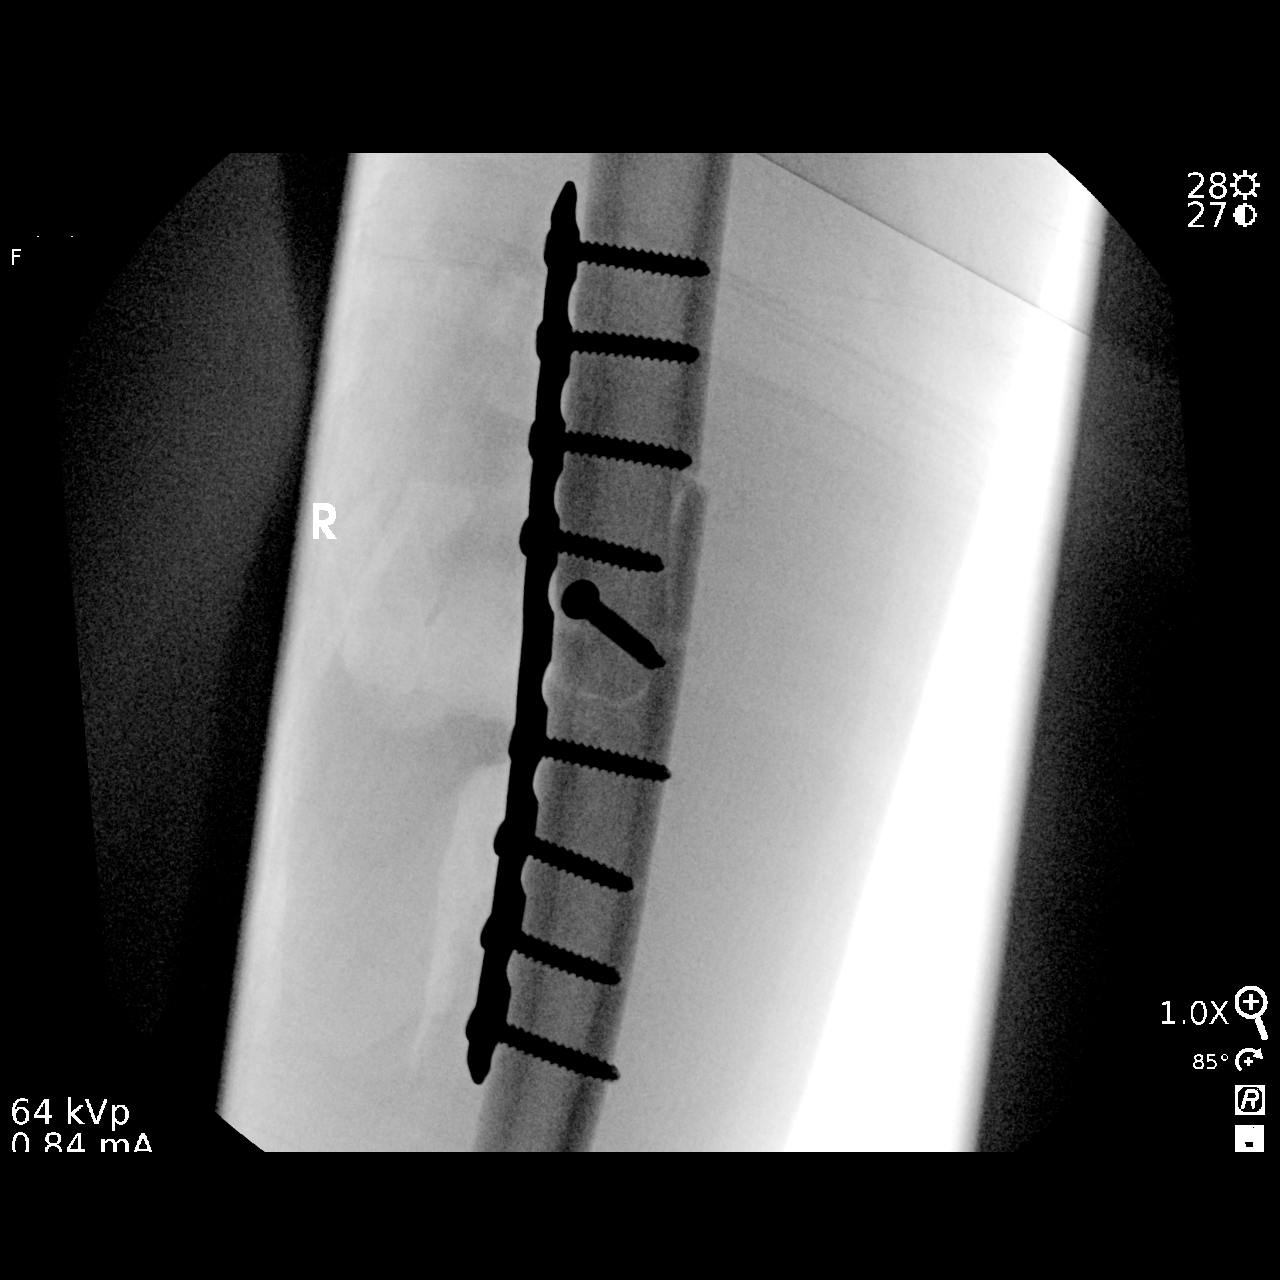

[2 of 2 positions shown; findings below may reference images not displayed]

FINDINGS: 2 C-arm fluoroscopic images were obtained intraoperatively and
submitted for post operative interpretation. Sideplate and screw
fixation construct traverse mid humeral diaphyseal fracture.
Fracture alignment is near anatomic. Air within the overlying soft
tissues noted. 14 seconds of fluoroscopy time was utilized. Please
see the performing provider's procedural report for further detail.
IMPRESSION: As above.

## 2021-10-06 ENCOUNTER — Other Ambulatory Visit (HOSPITAL_COMMUNITY): Payer: Self-pay

## 2021-10-24 ENCOUNTER — Emergency Department (HOSPITAL_BASED_OUTPATIENT_CLINIC_OR_DEPARTMENT_OTHER)
Admission: EM | Admit: 2021-10-24 | Discharge: 2021-10-24 | Disposition: A | Discharge status: Home / Self Care | Payer: Medicaid Other | Attending: Emergency Medicine | Admitting: Emergency Medicine

## 2021-10-24 ENCOUNTER — Encounter (HOSPITAL_BASED_OUTPATIENT_CLINIC_OR_DEPARTMENT_OTHER): Payer: Self-pay | Admitting: Emergency Medicine

## 2021-10-24 ENCOUNTER — Other Ambulatory Visit: Payer: Self-pay

## 2021-10-24 DIAGNOSIS — S61412A Laceration without foreign body of left hand, initial encounter: Secondary | ICD-10-CM | POA: Diagnosis not present

## 2021-10-24 DIAGNOSIS — Z23 Encounter for immunization: Secondary | ICD-10-CM | POA: Insufficient documentation

## 2021-10-24 DIAGNOSIS — W5503XA Scratched by cat, initial encounter: Secondary | ICD-10-CM | POA: Insufficient documentation

## 2021-10-24 DIAGNOSIS — Z7982 Long term (current) use of aspirin: Secondary | ICD-10-CM | POA: Insufficient documentation

## 2021-10-24 DIAGNOSIS — S6992XA Unspecified injury of left wrist, hand and finger(s), initial encounter: Secondary | ICD-10-CM | POA: Diagnosis present

## 2021-10-24 MED ORDER — DOXYCYCLINE HYCLATE 100 MG PO CAPS: 100.0000 mg | ORAL_CAPSULE | Freq: Two times a day (BID) | ORAL | 0 refills | Status: DC

## 2021-10-24 MED ORDER — TETANUS-DIPHTH-ACELL PERTUSSIS 5-2.5-18.5 LF-MCG/0.5 IM SUSY
0.5000 mL | PREFILLED_SYRINGE | Freq: Once | INTRAMUSCULAR | Status: AC
  Administered 2021-10-24: 0.5 mL via INTRAMUSCULAR
  Filled 2021-10-24: qty 0.5

## 2021-10-24 NOTE — ED Triage Notes (Signed)
Pt's cat bit her on left hand

## 2023-07-25 ENCOUNTER — Other Ambulatory Visit: Payer: Self-pay

## 2023-07-27 LAB — SURGICAL PATHOLOGY

## 2024-04-10 ENCOUNTER — Other Ambulatory Visit: Payer: Self-pay | Admitting: Family Medicine

## 2024-04-10 DIAGNOSIS — R7989 Other specified abnormal findings of blood chemistry: Secondary | ICD-10-CM

## 2024-04-17 ENCOUNTER — Encounter: Payer: Self-pay | Admitting: Obstetrics and Gynecology

## 2024-04-17 ENCOUNTER — Ambulatory Visit: Admitting: Obstetrics and Gynecology

## 2024-04-17 VITALS — BP 120/74 | HR 74 | Temp 98.1°F | Ht 63.25 in | Wt 125.0 lb

## 2024-04-17 DIAGNOSIS — D069 Carcinoma in situ of cervix, unspecified: Secondary | ICD-10-CM | POA: Diagnosis not present

## 2024-04-17 NOTE — Patient Instructions (Signed)
 Preoperative instructions: Nothing to eat or drink after midnight, unless instructed differently regarding clear liquids by the anesthesia team at [facility] health. Do not take any medications on the day of surgery, except those listed below: NONE Please follow all other instructions as provided by our surgical scheduler at [facility] and the anesthesia team at [facility] health.  Postoperative instructions: Take ibuprofen  as prescribed and over-the-counter Tylenol  as needed. (DO NOT TAKE IBUPROFEN  IF YOU HAVE CONTRAINDICATIONS THAT MAY INCLUDE USE OF BLOOD THINNERS, HISTORY STOMACH SURGERY OR GASTRITIS, CHRONIC KIDNEY DISEASE, ALLERGY, PRIOR INSTRUCTION TO AVOID IBUPROFEN -LIKE MEDICATIONS.)

## 2024-04-17 NOTE — Assessment & Plan Note (Addendum)
 We discussed cervical cancer screening and colposcopic biopsies. Per ASCCP guidelines treatment is recommended with excisional procedure. We reviewed both LEEP and CKC procedures. Given menopause and transformation zone not seen on colposcopy, I recommend proceeding with a CKC.  Discussed outpatient procedure. Reviewed that recovery is usually 2 weeks. Reviewed risks including infection, bleeding, damage to surrounding structures. Reviewed risk of recurrence, is higher with more severe dysplasia and will depend on final pathology.  Recommend NPO prior to midnight and reviewed medication to take on day of surgery. Discussed use of NSAIDS as needed for pain postoperatively.  Preop checklist: Antibiotics: none DVT ppx: SCDs Postop visit: 2 week Additional clearance: none  Encouraged smoking cessation.

## 2024-04-17 NOTE — Progress Notes (Signed)
 53 y.o. [id number] postmenopausal female with tobacco use, HTN, anxiety here for referral for CIN III. 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  No LMP recorded (lmp unknown). Patient is postmenopausal.   She reports normal PAP in 2020- NIL, HPV neg ([facility]). No prior Gardasil vaccine New partner since last PAP. 03/05/24 ASC-H, HPV neg 03/26/24 colpo with [facility]  +CIN3 (3:00 and 9:00 biopsies benign)  Had right wrist surgery October 2024, planning to RTW January 2026. Mirena IUD removed Nov 2025, [facility]. No CP or SOB during work or with climbing stairs.  OB History  Gravida Para Term Preterm AB Living  4 4 4   4   SAB IAB Ectopic Multiple Live Births      4    # Outcome Date GA Lbr Len/2nd Weight Sex Type Anes PTL Lv  4 Term      Vag-Spont   LIV  3 Term      Vag-Spont   LIV  2 Term      Vag-Spont   LIV  1 Term      Vag-Spont   LIV   Past Medical History:  Diagnosis Date   Depression    Hypertension    Past Surgical History:  Procedure Laterality Date   ARTHRODESIS METATARSALPHALANGEAL JOINT (MTPJ) Right 11/16/2018   Procedure: Right hallux metatarsal phalangeal joint arthrodesis;  Surgeon: Kit Rush, MD;  Location: [facility];  Service: Orthopedics;  Laterality: Right;    AUGMENTATION MAMMAPLASTY Bilateral    BREAST SURGERY     HAND SURGERY Right 02/2024   MOUTH SURGERY     gum graft   ORIF HUMERUS FRACTURE Right 07/19/2019   Procedure: OPEN REDUCTION INTERNAL FIXATION (ORIF) HUMERAL SHAFT FRACTURE;  Surgeon: Sharl Selinda Dover, MD;  Location: [facility] OR;  Service: Orthopedics;  Laterality: Right;  2 hrs RNFA   TIBIA IM NAIL INSERTION Left 02/04/2015   Procedure: INTRAMEDULLARY (IM) NAIL TIBIAL;  Surgeon: Jerona Harden GAILS, MD;  Location: [facility] OR;  Service: Orthopedics;  Laterality: Left;   TONSILLECTOMY     Medications Ordered Prior to Encounter[1] Allergies[2]    PE Today's Vitals   04/17/24 1349  BP:  120/74  Pulse: 74  Temp: 98.1 F (36.7 C)  TempSrc: Oral  SpO2: 98%  Weight: 125 lb (56.7 kg)  Height: 5' 3.25 (1.607 m)   Body mass index is 21.97 kg/m.  Physical Exam Vitals reviewed. Exam conducted with a chaperone present.  Constitutional:      General: She is not in acute distress.    Appearance: Normal appearance.  HENT:     Head: Normocephalic and atraumatic.     Nose: Nose normal.  Eyes:     Extraocular Movements: Extraocular movements intact.     Conjunctiva/sclera: Conjunctivae normal.  Pulmonary:     Effort: Pulmonary effort is normal.  Genitourinary:    General: Normal vulva.     Exam position: Lithotomy position.     Vagina: Normal. No vaginal discharge.     Cervix: Normal. No cervical motion tenderness, discharge or lesion.     Uterus: Normal. Not enlarged and not tender.      Adnexa: Right adnexa normal and left adnexa normal.  Musculoskeletal:        General: Normal range of motion.     Cervical back: Normal range of motion.  Neurological:     General: No focal deficit present.     Mental Status: She is alert.  Psychiatric:  Mood and Affect: Mood normal.        Behavior: Behavior normal.      Assessment and Plan:        High grade squamous intraepithelial lesion (HGSIL), grade 3 CIN, on biopsy of cervix Assessment & Plan: We discussed cervical cancer screening and colposcopic biopsies. Per ASCCP guidelines treatment is recommended with excisional procedure. We reviewed both LEEP and CKC procedures. Given menopause and transformation zone not seen on colposcopy, I recommend proceeding with a CKC.  Discussed outpatient procedure. Reviewed that recovery is usually 2 weeks. Reviewed risks including infection, bleeding, damage to surrounding structures. Reviewed risk of recurrence, is higher with more severe dysplasia and will depend on final pathology.  Recommend NPO prior to midnight and reviewed medication to take on day of surgery. Discussed  use of NSAIDS as needed for pain postoperatively.  Preop checklist: Antibiotics: none DVT ppx: SCDs Postop visit: 2 week Additional clearance: none  Encouraged smoking cessation.   Orders: -     Ambulatory Referral For Surgery Scheduling    Vera LULLA Pa, MD       [1]  Current Outpatient Medications on File Prior to Visit  Medication Sig Dispense Refill   aspirin  EC 325 MG tablet Take 1 tablet (325 mg total) by mouth daily. 30 tablet 0   lisinopril (ZESTRIL) 10 MG tablet Take 10 mg by mouth daily.     sertraline (ZOLOFT) 50 MG tablet Take by mouth.     triamcinolone cream (KENALOG) 0.1 % 1 APPLICATION EXTERNALLY ONCE A DAY AS NEEDED 30 DAYS     valACYclovir (VALTREX) 500 MG tablet 2 TABS IN AM AND 2 TABS IN PM ONCE FOR OUTBREAK AS NEEDED ORALLY 30 DAYS     No current facility-administered medications on file prior to visit.  [2]  Allergies Allergen Reactions   Penicillins Swelling and Other (See Comments)    Childhood allergic reaction - pt was never told the reaction and does not recall whether or not she was hospitalized.  Did it involve swelling of the face/tongue/throat, SOB, or low BP? Y Did it involve sudden or severe rash/hives, skin peeling, or any reaction on the inside of your mouth or nose? Y Did you need to seek medical attention at a hospital or doctor's office? UNK When did it last happen?  Childhood     If all above answers are NO, may proceed with cephalosporin use.

## 2024-04-18 ENCOUNTER — Telehealth: Payer: Self-pay | Admitting: *Deleted

## 2024-04-18 DIAGNOSIS — D069 Carcinoma in situ of cervix, unspecified: Secondary | ICD-10-CM

## 2024-04-18 NOTE — Telephone Encounter (Signed)
 Call returned to Gina Fuentes. Gina Fuentes states she was seen in the office on 04/17/24, states referral to oncologist was discussed. Gina Fuentes request to proceed with referral to oncology, would like to hold on surgery until after consult completed. Would like referral to provider Dr. Dallie recommends. Advised I will update Dr. Dallie, our office will f/u with recommendations. Gina Fuentes agreeable.   Routing to Dr. Dallie.

## 2024-04-19 ENCOUNTER — Inpatient Hospital Stay: Admission: RE | Admit: 2024-04-19 | Discharge: 2024-04-19 | Attending: Family Medicine | Admitting: Family Medicine

## 2024-04-19 DIAGNOSIS — R7989 Other specified abnormal findings of blood chemistry: Secondary | ICD-10-CM

## 2024-04-20 NOTE — Telephone Encounter (Signed)
 GYN ONC referral authorized.   Patient notified.   Routing to [employer].   Encounter closed.

## 2024-04-20 NOTE — Telephone Encounter (Signed)
 Referral placed to gyn onc for management discussion.

## 2024-04-25 ENCOUNTER — Telehealth: Payer: Self-pay

## 2024-04-25 ENCOUNTER — Encounter: Payer: Self-pay | Admitting: Gynecologic Oncology

## 2024-04-25 NOTE — Telephone Encounter (Signed)
 Spoke with Gina Fuentes regarding her referral to GYN oncology. She has an appointment scheduled with Gina Fuentes on 12/26 at 11:15. Patient agrees to date and time. She has been provided with office address and location. She is also aware of our mask and visitor policy. Patient verbalized understanding and will call with any questions.

## 2024-04-27 ENCOUNTER — Inpatient Hospital Stay: Admitting: Gynecologic Oncology

## 2024-04-27 ENCOUNTER — Encounter: Payer: Self-pay | Admitting: Gynecologic Oncology

## 2024-04-27 VITALS — BP 150/99 | HR 96 | Temp 98.5°F | Resp 19 | Ht 62.5 in | Wt 127.6 lb

## 2024-04-27 DIAGNOSIS — Z8619 Personal history of other infectious and parasitic diseases: Secondary | ICD-10-CM

## 2024-04-27 DIAGNOSIS — Z720 Tobacco use: Secondary | ICD-10-CM | POA: Diagnosis not present

## 2024-04-27 DIAGNOSIS — D069 Carcinoma in situ of cervix, unspecified: Secondary | ICD-10-CM

## 2024-04-27 NOTE — H&P (View-Only) (Signed)
 GYNECOLOGIC ONCOLOGY NEW PATIENT CONSULTATION   Patient Name: Gina Fuentes  Patient Age: 53 y.o. 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  We discussed the diagnosis of high-grade dysplasia including the discordance between ASC-H cytology with positive HPV and benign cervical biopsies, yet CIN3 on ECC. Given these results, high-grade dysplasia is located higher in the endocervical canal and was not visualized during colposcopy.  On my exam today, I do not see visible cervical lesions.  We discussed the role that high-risk HPV plays in the development of high-grade dysplasia and cancer of the cervix.  Patient has a history of genital warts in her teens, we reviewed the role that typically low risk HPV plays in genital warts.  Given high grade disease, increased risk of progression to cancer, and need to rule out cancer currently, discussed recommendation for a diagnostic and hopefully therapeutic excisional procedure. Reviewed the options of conization or LEEP procedure.  Given better ability to evaluate the margins and deeper sampling of the endocervical canal, I recommend that we proceed with cold knife conization.  If excisional procedure confirms high-grade dysplasia, no underlying cancer, and achieves negative margins, discussed need for long-term surveillance with continued close follow-up and screening.  If margins were positive for high-grade dysplasia, discussed need for additional treatment either with excision or hysterectomy.  Patient was offered to have this procedure with me or with her OB/GYN.  At the end of the visit today, she voiced preference to move forward with scheduling this surgery with me.  We discussed the plan for cold knife conization and endocervical  curettage.  Reviewed risks which include but are not limited to bleeding, need for blood transfusion, infection, damage to surrounding structures requiring repair, VTE, stroke, heart attack, and rarely death.  Patient was given perioperative education materials.  With the holiday, my office will call next week to secure date for the procedure.  I also encouraged decreasing tobacco use or tobacco cessation.  Reviewed the increased risk of HPV related dysplasia and cancer in the setting of tobacco use.  A copy of this note was sent to the patient's referring provider.   60 minutes of total time was spent for this patient encounter, including preparation, face-to-face counseling with the patient and coordination of care, and documentation of the encounter.  Comer Dollar, MD  Division of Gynecologic Oncology  Department of Obstetrics and Gynecology  [facility]  ___________________________________________  Chief Complaint: No chief complaint on file.   History of Present Illness:  Gina Fuentes is a 53 y.o. y.o. female who is seen in consultation at the request of Dr. Vera Dallie for an evaluation of cervical dysplasia.  03/2019: NILM pap, HR HPV negative New partner since pap in 2020. 03/05/24: ASC-H pap, HPV positive (comment on the report that HPV genotype reflex was not performed) 03/26/24: colposcopy. Cervical biopsies at 3 and 9 o'clock benign. CIN3 on ECC.  Patient comes in with one of her daughters today.  She reports doing well.  Denies any vaginal bleeding.  Denies any abdominal or pelvic pain.  Endorses a good appetite without nausea or emesis.  Reports baseline bowel and bladder function.  Denies any recent weight changes.  Patient works as a child psychotherapist.  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  Denies  any abnormal Paps until recent Pap test.  PAST MEDICAL HISTORY:  Past Medical History:  Diagnosis Date   Depression     Hypertension      PAST SURGICAL HISTORY:  Past Surgical History:  Procedure Laterality Date   ARTHRODESIS METATARSALPHALANGEAL JOINT (MTPJ) Right 11/16/2018   Procedure: Right hallux metatarsal phalangeal joint arthrodesis;  Surgeon: Kit Rush, MD;  Location: [facility];  Service: Orthopedics;  Laterality: Right;    AUGMENTATION MAMMAPLASTY Bilateral    BREAST SURGERY     HAND SURGERY Right 02/2024   MOUTH SURGERY     gum graft   ORIF HUMERUS FRACTURE Right 07/19/2019   Procedure: OPEN REDUCTION INTERNAL FIXATION (ORIF) HUMERAL SHAFT FRACTURE;  Surgeon: Sharl Selinda Dover, MD;  Location: [facility] OR;  Service: Orthopedics;  Laterality: Right;  2 hrs RNFA   TIBIA IM NAIL INSERTION Left 02/04/2015   Procedure: INTRAMEDULLARY (IM) NAIL TIBIAL;  Surgeon: Jerona Harden GAILS, MD;  Location: [facility] OR;  Service: Orthopedics;  Laterality: Left;   TONSILLECTOMY      OB/GYN HISTORY:  OB History  Gravida Para Term Preterm AB Living  4 4 4   4   SAB IAB Ectopic Multiple Live Births      4    # Outcome Date GA Lbr Len/2nd Weight Sex Type Anes PTL Lv  4 Term      Vag-Spont   LIV  3 Term      Vag-Spont   LIV  2 Term      Vag-Spont   LIV  1 Term      Vag-Spont   LIV    No LMP recorded (lmp unknown). Patient is postmenopausal.  Age at menarche: 1  Age at menopause: Unsure.  Had an IUD in for the last 21 years, recently removed.  Never really developed menopausal symptoms.  Hormone testing recently confirm menopause Hx of HRT: denies Hx of STDs: see HPIV, HSV and HPV Last pap: see HPI History of abnormal pap smears: see HPI  SCREENING STUDIES:  Last mammogram: unsure  Last colonoscopy: has not had  MEDICATIONS: Outpatient Encounter Medications as of 04/27/2024  Medication Sig   lisinopril (ZESTRIL) 10 MG tablet Take 10 mg by mouth daily.   sertraline (ZOLOFT) 50 MG tablet Take by mouth.   triamcinolone cream (KENALOG) 0.1 % 1 APPLICATION EXTERNALLY ONCE A DAY AS  NEEDED 30 DAYS   valACYclovir (VALTREX) 500 MG tablet 2 TABS IN AM AND 2 TABS IN PM ONCE FOR OUTBREAK AS NEEDED ORALLY 30 DAYS   [DISCONTINUED] aspirin  EC 325 MG tablet Take 1 tablet (325 mg total) by mouth daily.   [DISCONTINUED] lisinopril (ZESTRIL) 10 MG tablet Take 10 mg by mouth daily.   No facility-administered encounter medications on file as of 04/27/2024.    ALLERGIES:  Allergies[1]   FAMILY HISTORY:  Family History  Problem Relation Age of Onset   Colon cancer Neg Hx    Breast cancer Neg Hx    Ovarian cancer Neg Hx    Endometrial cancer Neg Hx    Pancreatic cancer Neg Hx    Prostate cancer Neg Hx      SOCIAL HISTORY:  Social Connections: Not on file    REVIEW OF SYSTEMS:  Denies appetite changes, fevers, chills, fatigue, unexplained weight changes. Denies hearing loss, neck lumps or masses, mouth sores, ringing in ears or voice changes. Denies cough or wheezing.  Denies shortness of breath. Denies chest pain or palpitations. Denies leg swelling. Denies abdominal  distention, pain, blood in stools, constipation, diarrhea, nausea, vomiting, or early satiety. Denies pain with intercourse, dysuria, frequency, hematuria or incontinence. Denies hot flashes, pelvic pain, vaginal bleeding or vaginal discharge.   Denies joint pain, back pain or muscle pain/cramps. Denies itching, rash, or wounds. Denies dizziness, headaches, numbness or seizures. Denies swollen lymph nodes or glands, denies easy bruising or bleeding. Denies anxiety, depression, confusion, or decreased concentration.  Physical Exam:  Vital Signs for this encounter:  Blood pressure (!) 150/99, pulse 96, temperature 98.5 F (36.9 C), temperature source Oral, resp. rate 19, height 5' 2.5 (1.588 m), weight 127 lb 9.6 oz (57.9 kg), SpO2 98%. Body mass index is 22.97 kg/m. General: Alert, oriented, no acute distress.  HEENT: Normocephalic, atraumatic. Sclera anicteric.  Chest: Clear to auscultation  bilaterally. No wheezes, rhonchi, or rales. Cardiovascular: Regular rate and rhythm, no murmurs, rubs, or gallops.  Abdomen: Normoactive bowel sounds. Soft, nondistended, nontender to palpation. No masses or hepatosplenomegaly appreciated. No palpable fluid wave.  Extremities: Grossly normal range of motion. Warm, well perfused. No edema bilaterally.  Skin: No rashes or lesions.  Lymphatics: No cervical, supraclavicular, or inguinal adenopathy.  GU:  Normal external female genitalia. No lesions. No discharge or bleeding.             Bladder/urethra:  No lesions or masses, well supported bladder             Vagina: Well-rugated, no lesions.             Cervix: Normal appearing, no lesions.  Bimanual exam, cervix is mildly firm, not barrel-shaped.             Uterus:  Small, mobile, no parametrial involvement or nodularity.             Adnexa: No masses appreciated.  Rectal: Deferred.  LABORATORY AND RADIOLOGIC DATA:  Outside medical records were reviewed to synthesize the above history, along with the history and physical obtained during the visit.   Lab Results  Component Value Date   WBC 5.2 07/19/2019   HGB 14.0 07/19/2019   HCT 42.2 07/19/2019   PLT 419 (H) 07/19/2019   GLUCOSE 105 (H) 07/19/2019   ALT 21 07/19/2019   AST 27 07/19/2019   NA 139 07/19/2019   K 4.1 07/19/2019   CL 104 07/19/2019   CREATININE 0.47 07/19/2019   BUN 12 07/19/2019   CO2 23 07/19/2019       [1]  Allergies Allergen Reactions   Penicillins Swelling and Other (See Comments)    Childhood allergic reaction - pt was never told the reaction and does not recall whether or not she was hospitalized.  Did it involve swelling of the face/tongue/throat, SOB, or low BP? Y Did it involve sudden or severe rash/hives, skin peeling, or any reaction on the inside of your mouth or nose? Y Did you need to seek medical attention at a hospital or doctor's office? UNK When did it last happen?  Childhood     If all  above answers are NO, may proceed with cephalosporin use.

## 2024-04-27 NOTE — Progress Notes (Signed)
 GYNECOLOGIC ONCOLOGY NEW PATIENT CONSULTATION   Patient Name: Gina Fuentes  Patient Age: 53 y.o. 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  We discussed the diagnosis of high-grade dysplasia including the discordance between ASC-H cytology with positive HPV and benign cervical biopsies, yet CIN3 on ECC. Given these results, high-grade dysplasia is located higher in the endocervical canal and was not visualized during colposcopy.  On my exam today, I do not see visible cervical lesions.  We discussed the role that high-risk HPV plays in the development of high-grade dysplasia and cancer of the cervix.  Patient has a history of genital warts in her teens, we reviewed the role that typically low risk HPV plays in genital warts.  Given high grade disease, increased risk of progression to cancer, and need to rule out cancer currently, discussed recommendation for a diagnostic and hopefully therapeutic excisional procedure. Reviewed the options of conization or LEEP procedure.  Given better ability to evaluate the margins and deeper sampling of the endocervical canal, I recommend that we proceed with cold knife conization.  If excisional procedure confirms high-grade dysplasia, no underlying cancer, and achieves negative margins, discussed need for long-term surveillance with continued close follow-up and screening.  If margins were positive for high-grade dysplasia, discussed need for additional treatment either with excision or hysterectomy.  Patient was offered to have this procedure with me or with her OB/GYN.  At the end of the visit today, she voiced preference to move forward with scheduling this surgery with me.  We discussed the plan for cold knife conization and endocervical  curettage.  Reviewed risks which include but are not limited to bleeding, need for blood transfusion, infection, damage to surrounding structures requiring repair, VTE, stroke, heart attack, and rarely death.  Patient was given perioperative education materials.  With the holiday, my office will call next week to secure date for the procedure.  I also encouraged decreasing tobacco use or tobacco cessation.  Reviewed the increased risk of HPV related dysplasia and cancer in the setting of tobacco use.  A copy of this note was sent to the patient's referring provider.   60 minutes of total time was spent for this patient encounter, including preparation, face-to-face counseling with the patient and coordination of care, and documentation of the encounter.  Comer Dollar, MD  Division of Gynecologic Oncology  Department of Obstetrics and Gynecology  [facility]  ___________________________________________  Chief Complaint: No chief complaint on file.   History of Present Illness:  Gina Fuentes is a 53 y.o. y.o. female who is seen in consultation at the request of Dr. Vera Dallie for an evaluation of cervical dysplasia.  03/2019: NILM pap, HR HPV negative New partner since pap in 2020. 03/05/24: ASC-H pap, HPV positive (comment on the report that HPV genotype reflex was not performed) 03/26/24: colposcopy. Cervical biopsies at 3 and 9 o'clock benign. CIN3 on ECC.  Patient comes in with one of her daughters today.  She reports doing well.  Denies any vaginal bleeding.  Denies any abdominal or pelvic pain.  Endorses a good appetite without nausea or emesis.  Reports baseline bowel and bladder function.  Denies any recent weight changes.  Patient works as a child psychotherapist.  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  Denies  any abnormal Paps until recent Pap test.  PAST MEDICAL HISTORY:  Past Medical History:  Diagnosis Date   Depression     Hypertension      PAST SURGICAL HISTORY:  Past Surgical History:  Procedure Laterality Date   ARTHRODESIS METATARSALPHALANGEAL JOINT (MTPJ) Right 11/16/2018   Procedure: Right hallux metatarsal phalangeal joint arthrodesis;  Surgeon: Kit Rush, MD;  Location: [facility];  Service: Orthopedics;  Laterality: Right;    AUGMENTATION MAMMAPLASTY Bilateral    BREAST SURGERY     HAND SURGERY Right 02/2024   MOUTH SURGERY     gum graft   ORIF HUMERUS FRACTURE Right 07/19/2019   Procedure: OPEN REDUCTION INTERNAL FIXATION (ORIF) HUMERAL SHAFT FRACTURE;  Surgeon: Sharl Selinda Dover, MD;  Location: [facility] OR;  Service: Orthopedics;  Laterality: Right;  2 hrs RNFA   TIBIA IM NAIL INSERTION Left 02/04/2015   Procedure: INTRAMEDULLARY (IM) NAIL TIBIAL;  Surgeon: Jerona Harden GAILS, MD;  Location: [facility] OR;  Service: Orthopedics;  Laterality: Left;   TONSILLECTOMY      OB/GYN HISTORY:  OB History  Gravida Para Term Preterm AB Living  4 4 4   4   SAB IAB Ectopic Multiple Live Births      4    # Outcome Date GA Lbr Len/2nd Weight Sex Type Anes PTL Lv  4 Term      Vag-Spont   LIV  3 Term      Vag-Spont   LIV  2 Term      Vag-Spont   LIV  1 Term      Vag-Spont   LIV    No LMP recorded (lmp unknown). Patient is postmenopausal.  Age at menarche: 1  Age at menopause: Unsure.  Had an IUD in for the last 21 years, recently removed.  Never really developed menopausal symptoms.  Hormone testing recently confirm menopause Hx of HRT: denies Hx of STDs: see HPIV, HSV and HPV Last pap: see HPI History of abnormal pap smears: see HPI  SCREENING STUDIES:  Last mammogram: unsure  Last colonoscopy: has not had  MEDICATIONS: Outpatient Encounter Medications as of 04/27/2024  Medication Sig   lisinopril (ZESTRIL) 10 MG tablet Take 10 mg by mouth daily.   sertraline (ZOLOFT) 50 MG tablet Take by mouth.   triamcinolone cream (KENALOG) 0.1 % 1 APPLICATION EXTERNALLY ONCE A DAY AS  NEEDED 30 DAYS   valACYclovir (VALTREX) 500 MG tablet 2 TABS IN AM AND 2 TABS IN PM ONCE FOR OUTBREAK AS NEEDED ORALLY 30 DAYS   [DISCONTINUED] aspirin  EC 325 MG tablet Take 1 tablet (325 mg total) by mouth daily.   [DISCONTINUED] lisinopril (ZESTRIL) 10 MG tablet Take 10 mg by mouth daily.   No facility-administered encounter medications on file as of 04/27/2024.    ALLERGIES:  Allergies[1]   FAMILY HISTORY:  Family History  Problem Relation Age of Onset   Colon cancer Neg Hx    Breast cancer Neg Hx    Ovarian cancer Neg Hx    Endometrial cancer Neg Hx    Pancreatic cancer Neg Hx    Prostate cancer Neg Hx      SOCIAL HISTORY:  Social Connections: Not on file    REVIEW OF SYSTEMS:  Denies appetite changes, fevers, chills, fatigue, unexplained weight changes. Denies hearing loss, neck lumps or masses, mouth sores, ringing in ears or voice changes. Denies cough or wheezing.  Denies shortness of breath. Denies chest pain or palpitations. Denies leg swelling. Denies abdominal  distention, pain, blood in stools, constipation, diarrhea, nausea, vomiting, or early satiety. Denies pain with intercourse, dysuria, frequency, hematuria or incontinence. Denies hot flashes, pelvic pain, vaginal bleeding or vaginal discharge.   Denies joint pain, back pain or muscle pain/cramps. Denies itching, rash, or wounds. Denies dizziness, headaches, numbness or seizures. Denies swollen lymph nodes or glands, denies easy bruising or bleeding. Denies anxiety, depression, confusion, or decreased concentration.  Physical Exam:  Vital Signs for this encounter:  Blood pressure (!) 150/99, pulse 96, temperature 98.5 F (36.9 C), temperature source Oral, resp. rate 19, height 5' 2.5 (1.588 m), weight 127 lb 9.6 oz (57.9 kg), SpO2 98%. Body mass index is 22.97 kg/m. General: Alert, oriented, no acute distress.  HEENT: Normocephalic, atraumatic. Sclera anicteric.  Chest: Clear to auscultation  bilaterally. No wheezes, rhonchi, or rales. Cardiovascular: Regular rate and rhythm, no murmurs, rubs, or gallops.  Abdomen: Normoactive bowel sounds. Soft, nondistended, nontender to palpation. No masses or hepatosplenomegaly appreciated. No palpable fluid wave.  Extremities: Grossly normal range of motion. Warm, well perfused. No edema bilaterally.  Skin: No rashes or lesions.  Lymphatics: No cervical, supraclavicular, or inguinal adenopathy.  GU:  Normal external female genitalia. No lesions. No discharge or bleeding.             Bladder/urethra:  No lesions or masses, well supported bladder             Vagina: Well-rugated, no lesions.             Cervix: Normal appearing, no lesions.  Bimanual exam, cervix is mildly firm, not barrel-shaped.             Uterus:  Small, mobile, no parametrial involvement or nodularity.             Adnexa: No masses appreciated.  Rectal: Deferred.  LABORATORY AND RADIOLOGIC DATA:  Outside medical records were reviewed to synthesize the above history, along with the history and physical obtained during the visit.   Lab Results  Component Value Date   WBC 5.2 07/19/2019   HGB 14.0 07/19/2019   HCT 42.2 07/19/2019   PLT 419 (H) 07/19/2019   GLUCOSE 105 (H) 07/19/2019   ALT 21 07/19/2019   AST 27 07/19/2019   NA 139 07/19/2019   K 4.1 07/19/2019   CL 104 07/19/2019   CREATININE 0.47 07/19/2019   BUN 12 07/19/2019   CO2 23 07/19/2019       [1]  Allergies Allergen Reactions   Penicillins Swelling and Other (See Comments)    Childhood allergic reaction - pt was never told the reaction and does not recall whether or not she was hospitalized.  Did it involve swelling of the face/tongue/throat, SOB, or low BP? Y Did it involve sudden or severe rash/hives, skin peeling, or any reaction on the inside of your mouth or nose? Y Did you need to seek medical attention at a hospital or doctor's office? UNK When did it last happen?  Childhood     If all  above answers are NO, may proceed with cephalosporin use.

## 2024-04-27 NOTE — Patient Instructions (Addendum)
 Preparing for your Surgery  Plan for surgery with Dr. Viktoria at [facility].  The office will call you early next week to confirm the date for your procedure. You will be scheduled for a cold knife conization, endocervical curettage (this is a bigger biopsy of the cervix and the scraping from the canal of the cervix that we discussed today).   Pre-operative Testing -You will receive a phone call from presurgical testing at [facility] to arrange for a pre-operative testing appointment before your surgery.  This appointment normally occurs one to two weeks before your scheduled surgery.   -Bring your insurance card, copy of an advanced directive if applicable, medication list  -At that visit, you will be asked to sign a consent for a possible blood transfusion in case a transfusion becomes necessary during surgery.  The need for a blood transfusion is rare but having consent is a necessary part of your care.     -You should not be taking blood thinners or aspirin  at least ten days prior to surgery unless instructed by your surgeon.  Day Before Surgery at Home -You will be asked to take in a light diet the day before surgery.  Avoid carbonated beverages.  You will be advised to have nothing to eat or drink after midnight the evening before.    Eat a light diet the day before surgery.  Examples including soups, broths, toast, yogurt, mashed potatoes.  Things to avoid include carbonated beverages (fizzy beverages), raw fruits and raw vegetables, or beans.   Your role in recovery Your role is to become active as soon as directed by your doctor, while still giving yourself time to heal.  Rest when you feel tired. You will be asked to do the following in order to speed your recovery:  - Cough and breathe deeply. This helps to clear and expand your lungs and can prevent pneumonia.  - Do mild physical activity. Walking or moving your legs help your circulation and body functions  return to normal. A staff member will help you when you try to walk and will provide you with simple exercises. Do not try to get up or walk alone the first time. - Actively manage your pain. Managing your pain lets you move in comfort. We will ask you to rate your pain on a scale of zero to 10. It is your responsibility to tell your doctor or nurse where and how much you hurt so your pain can be treated.  Special Considerations -If you are diabetic, you may be placed on insulin after surgery to have closer control over your blood sugars to promote healing and recovery.  This does not mean that you will be discharged on insulin.  If applicable, your oral antidiabetics will be resumed when you are tolerating a solid diet.  -Your final pathology results from surgery should be available around one week after surgery and the results will be relayed to you when available.  -FMLA forms can be faxed to [phone] and please allow 5-7 business days for completion.  Pain Management After Surgery -You will be prescribed your pain medication on the day of surgery.  -Make sure that you have Tylenol  and Ibuprofen IF YOU ARE ABLE TO TAKE THESE MEDICATIONS at home to use on a regular basis after surgery for pain control. We recommend alternating the medications every hour to six hours since they work differently and are processed in the body differently for pain relief.  Bowel Regimen -  You will be prescribed Sennakot-S to take nightly to prevent constipation especially if you are taking the narcotic pain medication intermittently.  It is important to prevent constipation and drink adequate amounts of liquids. You can stop taking this medication when you are not taking pain medication and you are back on your normal bowel routine.  Risks of Surgery Risks of surgery are low but include bleeding, infection, damage to surrounding structures, re-operation, blood clots, and very rarely death.   Blood Transfusion  Information (For the consent to be signed before surgery)  We will be checking your blood type before surgery so in case of emergencies, we will know what type of blood you would need.                                            WHAT IS A BLOOD TRANSFUSION?  A transfusion is the replacement of blood or some of its parts. Blood is made up of multiple cells which provide different functions. Red blood cells carry oxygen and are used for blood loss replacement. White blood cells fight against infection. Platelets control bleeding. Plasma helps clot blood. Other blood products are available for specialized needs, such as hemophilia or other clotting disorders. BEFORE THE TRANSFUSION  Who gives blood for transfusions?  You may be able to donate blood to be used at a later date on yourself (autologous donation). Relatives can be asked to donate blood. This is generally not any safer than if you have received blood from a stranger. The same precautions are taken to ensure safety when a relative's blood is donated. Healthy volunteers who are fully evaluated to make sure their blood is safe. This is blood bank blood. Transfusion therapy is the safest it has ever been in the practice of medicine. Before blood is taken from a donor, a complete history is taken to make sure that person has no history of diseases nor engages in risky social behavior (examples are intravenous drug use or sexual activity with multiple partners). The donor's travel history is screened to minimize risk of transmitting infections, such as malaria. The donated blood is tested for signs of infectious diseases, such as HIV and hepatitis. The blood is then tested to be sure it is compatible with you in order to minimize the chance of a transfusion reaction. If you or a relative donates blood, this is often done in anticipation of surgery and is not appropriate for emergency situations. It takes many days to process the donated  blood. RISKS AND COMPLICATIONS Although transfusion therapy is very safe and saves many lives, the main dangers of transfusion include:  Getting an infectious disease. Developing a transfusion reaction. This is an allergic reaction to something in the blood you were given. Every precaution is taken to prevent this. The decision to have a blood transfusion has been considered carefully by your caregiver before blood is given. Blood is not given unless the benefits outweigh the risks.  AFTER SURGERY INSTRUCTIONS  Return to work: 1-3 weeks if applicable  Activity: 1. Be up and out of the bed during the day.  Take a nap if needed.  You may walk up steps but be careful and use the hand rail.  Stair climbing will tire you more than you think, you may need to stop part way and rest.   2. No lifting or straining for 6  weeks over 20-25 pounds. No pushing, pulling, straining for 6 weeks.  3. No driving for 4-89 days when the following criteria have been met: Do not drive if you are taking narcotic pain medicine and make sure that your reaction time has returned.   4. You can shower as soon as the next day after surgery. Avoid tub baths for 4-6 weeks.  5. No sexual activity and nothing in the vagina for 6 weeks.  6. You may experience vaginal spotting after surgery and you may pass some material that I use to help prevent bleeding at the time of surgery.  Please call us  if you are having heavier bleeding.  7. Take Tylenol  or ibuprofen first for pain if you are able to take these medications and only use narcotic pain medication for severe pain not relieved by the Tylenol  or Ibuprofen.  Monitor your Tylenol  intake to a max of 4,000 mg in a 24 hour period. You can alternate these medications after surgery.  Diet: 1. Low sodium Heart Healthy Diet is recommended but you are cleared to resume your normal (before surgery) diet after your procedure.  2. It is safe to use a laxative, such as Miralax or  Colace, if you have difficulty moving your bowels before surgery.   Reasons to call the Doctor: Fever - Oral temperature greater than 100.4 degrees Fahrenheit Foul-smelling vaginal discharge Difficulty urinating Nausea and vomiting Increased pain that is unrelieved with pain medicine. Difficulty breathing with or without chest pain New calf pain especially if only on one side Sudden, continuing increased vaginal bleeding with or without clots.   Contacts: For questions or concerns you should contact:  Dr. Comer Dollar at [phone]  Eleanor Epps, NP at [phone]  After Hours: call [phone] and have the GYN Oncologist paged/contacted (after 5 pm or on the weekends). You will speak with an after hours RN and let he or she know you have had surgery.  Messages sent via mychart are for non-urgent matters and are not responded to after hours so for urgent needs, please call the after hours number.

## 2024-05-01 ENCOUNTER — Encounter: Payer: Self-pay | Admitting: Nurse Practitioner

## 2024-05-04 ENCOUNTER — Telehealth: Payer: Self-pay

## 2024-05-04 NOTE — Telephone Encounter (Signed)
 Gina Fuentes has chosen 1/14 for surgery with Dr.Tucker. She is aware of getting a call from [facility] pre-admit for an appointment/labs.

## 2024-05-08 NOTE — Progress Notes (Signed)
 Date of COVID positive in last 90 days:  PCP - Dorothyann Brooks, NP Cardiologist - n/a  Chest x-ray - N/A EKG - 05/09/24 Epic/chart Stress Test - N/A ECHO - N/A Cardiac Cath - N/A Pacemaker/ICD device last checked:N/A Spinal Cord Stimulator:N/A  Bowel Prep - N/A  Sleep Study - N/A CPAP -   Fasting Blood Sugar - N/A Checks Blood Sugar _____ times a day  Last dose of GLP1 agonist-  N/A GLP1 instructions:  Do not take after     Last dose of SGLT-2 inhibitors-  N/A SGLT-2 instructions:  Do not take after     Blood Thinner Instructions: N/A Last dose:   Time: Aspirin  Instructions:N/A Last Dose:  Activity level: Can go up a flight of stairs and perform activities of daily living without stopping and without symptoms of chest pain or shortness of breath.  Anesthesia review: N/A  Patient denies shortness of breath, fever, cough and chest pain at PAT appointment  Patient verbalized understanding of instructions that were given to them at the PAT appointment. Patient was also instructed that they will need to review over the PAT instructions again at home before surgery.

## 2024-05-08 NOTE — Patient Instructions (Signed)
 SURGICAL WAITING ROOM VISITATION  Patients having surgery or a procedure may have no more than 2 support people in the waiting area - these visitors may rotate.    Children ages 94 and under will not be able to visit patients in [facility] under most circumstances.   Visitors with respiratory illnesses are discouraged from visiting and should remain at home.  If the patient needs to stay at the hospital during part of their recovery, the visitor guidelines for inpatient rooms apply. Pre-op nurse will coordinate an appropriate time for 1 support person to accompany patient in pre-op.  This support person may not rotate.    Please refer to the [facility] website for the visitor guidelines for Inpatients (after your surgery is over and you are in a regular room).    Your procedure is scheduled on: 05/16/24   Report to [facility] Main Entrance    Report to admitting at 6:15 AM   Call this number if you have problems the morning of surgery [phone]   Do not eat food :After Midnight.   After Midnight you may have the following liquids until 5:30 AM DAY OF SURGERY  Water Non-Citrus Juices (without pulp, NO RED-Apple, White grape, White cranberry) Black Coffee (NO MILK/CREAM OR CREAMERS, sugar ok)  Clear Tea (NO MILK/CREAM OR CREAMERS, sugar ok) regular and decaf                             Plain Jell-O (NO RED)                                           Fruit ices (not with fruit pulp, NO RED)                                     Popsicles (NO RED)                                                               Sports drinks like Gatorade (NO RED)          If you have questions, please contact your surgeons office.   FOLLOW BOWEL PREP AND ANY ADDITIONAL PRE OP INSTRUCTIONS YOU RECEIVED FROM YOUR SURGEON'S OFFICE!!!     Oral Hygiene is also important to reduce your risk of infection.                                    Remember - BRUSH YOUR TEETH THE MORNING OF  SURGERY WITH YOUR REGULAR TOOTHPASTE  DENTURES WILL BE REMOVED PRIOR TO SURGERY PLEASE DO NOT APPLY Poly grip OR ADHESIVES!!!   Stop all vitamins and herbal supplements 7 days before surgery.   Take these medicines the morning of surgery with A SIP OF WATER: Sertraline              You may not have any metal on your body including hair pins, jewelry, and body piercing  Do not wear make-up, lotions, powders, perfumes, or deodorant  Do not wear nail polish including gel and S&S, artificial/acrylic nails, or any other type of covering on natural nails including finger and toenails. If you have artificial nails, gel coating, etc. that needs to be removed by a nail salon please have this removed prior to surgery or surgery may need to be canceled/ delayed if the surgeon/ anesthesia feels like they are unable to be safely monitored.   Do not shave  48 hours prior to surgery.    Do not bring valuables to the hospital. [place] IS NOT             RESPONSIBLE   FOR VALUABLES.   Contacts, glasses, dentures or bridgework may not be worn into surgery.  DO NOT BRING YOUR HOME MEDICATIONS TO THE HOSPITAL. PHARMACY WILL DISPENSE MEDICATIONS LISTED ON YOUR MEDICATION LIST TO YOU DURING YOUR ADMISSION IN THE HOSPITAL!    Patients discharged on the day of surgery will not be allowed to drive home.  Someone NEEDS to stay with you for the first 24 hours after anesthesia.              Please read over the following fact sheets you were given: IF YOU HAVE QUESTIONS ABOUT YOUR PRE-OP INSTRUCTIONS PLEASE CALL [phone].   If you received a COVID test during your pre-op visit  it is requested that you wear a mask when out in public, stay away from anyone that may not be feeling well and notify your surgeon if you develop symptoms. If you test positive for Covid or have been in contact with anyone that has tested positive in the last 10 days please notify you surgeon.    [facility] -  Preparing for Surgery Before surgery, you can play an important role.  Because skin is not sterile, your skin needs to be as free of germs as possible.  You can reduce the number of germs on your skin by washing with CHG (chlorahexidine gluconate) soap before surgery.  CHG is an antiseptic cleaner which kills germs and bonds with the skin to continue killing germs even after washing. Please DO NOT use if you have an allergy to CHG or antibacterial soaps.  If your skin becomes reddened/irritated stop using the CHG and inform your nurse when you arrive at Short Stay. Do not shave (including legs and underarms) for at least 48 hours prior to the first CHG shower.  You may shave your face/neck.  Please follow these instructions carefully:  1.  Shower with CHG Soap the night before surgery ONLY (DO NOT USE THE SOAP THE MORNING OF SURGERY).  2.  If you choose to wash your hair, wash your hair first as usual with your normal  shampoo.  3.  After you shampoo, rinse your hair and body thoroughly to remove the shampoo.                             4.  Use CHG as you would any other liquid soap.  You can apply chg directly to the skin and wash.  Gently with a scrungie or clean washcloth.  5.  Apply the CHG Soap to your body ONLY FROM THE NECK DOWN.   Do   not use on face/ open  Wound or open sores. Avoid contact with eyes, ears mouth and   genitals (private parts).                       Wash face,  Genitals (private parts) with your normal soap.             6.  Wash thoroughly, paying special attention to the area where your    surgery  will be performed.  7.  Thoroughly rinse your body with warm water from the neck down.  8.  DO NOT shower/wash with your normal soap after using and rinsing off the CHG Soap.                9.  Pat yourself dry with a clean towel.            10.  Wear clean pajamas.            11.  Place clean sheets on your bed the night of your first shower and do not   sleep with pets. Day of Surgery : Do not apply any CHG, lotions/deodorants the morning of surgery.  Please wear clean clothes to the hospital/surgery center.  FAILURE TO FOLLOW THESE INSTRUCTIONS MAY RESULT IN THE CANCELLATION OF YOUR SURGERY  PATIENT SIGNATURE_________________________________  NURSE SIGNATURE__________________________________  ________________________________________________________________________

## 2024-05-09 ENCOUNTER — Encounter (HOSPITAL_COMMUNITY)
Admission: RE | Admit: 2024-05-09 | Discharge: 2024-05-09 | Disposition: A | Discharge status: Home / Self Care | Source: Ambulatory Visit | Attending: Gynecologic Oncology | Admitting: Gynecologic Oncology

## 2024-05-09 ENCOUNTER — Encounter (HOSPITAL_COMMUNITY): Payer: Self-pay

## 2024-05-09 ENCOUNTER — Other Ambulatory Visit: Payer: Self-pay

## 2024-05-09 VITALS — BP 134/98 | HR 85 | Temp 98.0°F | Resp 16 | Ht 62.0 in | Wt 122.0 lb

## 2024-05-09 DIAGNOSIS — Z01810 Encounter for preprocedural cardiovascular examination: Secondary | ICD-10-CM | POA: Diagnosis present

## 2024-05-09 DIAGNOSIS — Z01812 Encounter for preprocedural laboratory examination: Secondary | ICD-10-CM | POA: Diagnosis present

## 2024-05-09 DIAGNOSIS — I10 Essential (primary) hypertension: Secondary | ICD-10-CM | POA: Insufficient documentation

## 2024-05-09 DIAGNOSIS — D069 Carcinoma in situ of cervix, unspecified: Secondary | ICD-10-CM | POA: Insufficient documentation

## 2024-05-09 DIAGNOSIS — Z01818 Encounter for other preprocedural examination: Secondary | ICD-10-CM | POA: Diagnosis not present

## 2024-05-09 LAB — CBC
HCT: 44.8 % (ref 36.0–46.0)
Hemoglobin: 15 g/dL (ref 12.0–15.0)
MCH: 33.8 pg (ref 26.0–34.0)
MCHC: 33.5 g/dL (ref 30.0–36.0)
MCV: 100.9 fL — ABNORMAL HIGH (ref 80.0–100.0)
Platelets: 251 K/uL (ref 150–400)
RBC: 4.44 MIL/uL (ref 3.87–5.11)
RDW: 13 % (ref 11.5–15.5)
WBC: 5.4 K/uL (ref 4.0–10.5)
nRBC: 0 % (ref 0.0–0.2)

## 2024-05-09 LAB — BASIC METABOLIC PANEL WITH GFR
Anion gap: 15 (ref 5–15)
BUN: <5 mg/dL — ABNORMAL LOW (ref 6–20)
CO2: 21 mmol/L — ABNORMAL LOW (ref 22–32)
Calcium: 9.4 mg/dL (ref 8.9–10.3)
Chloride: 100 mmol/L (ref 98–111)
Creatinine, Ser: 0.49 mg/dL (ref 0.44–1.00)
GFR, Estimated: >60 mL/min
Glucose, Bld: 100 mg/dL — ABNORMAL HIGH (ref 70–99)
Potassium: 4.3 mmol/L (ref 3.5–5.1)
Sodium: 135 mmol/L (ref 135–145)

## 2024-05-15 ENCOUNTER — Telehealth: Payer: Self-pay | Admitting: *Deleted

## 2024-05-15 NOTE — Telephone Encounter (Signed)
 Telephone call to check on pre-operative status.  Patient compliant with pre-operative instructions.  Reinforced nothing to eat after midnight. Clear liquids until 0515. Patient to arrive at 0615.  No questions or concerns voiced.  Instructed to call for any needs.

## 2024-05-15 NOTE — Telephone Encounter (Signed)
Attempted to reach patient for pre-op call. Left voicemail requesting call back to [phone].

## 2024-05-16 ENCOUNTER — Ambulatory Visit (HOSPITAL_COMMUNITY): Admitting: Certified Registered Nurse Anesthetist

## 2024-05-16 ENCOUNTER — Encounter (HOSPITAL_COMMUNITY)
Admission: RE | Disposition: A | Discharge status: Home / Self Care | Payer: Self-pay | Source: Ambulatory Visit | Attending: Gynecologic Oncology

## 2024-05-16 ENCOUNTER — Ambulatory Visit (HOSPITAL_COMMUNITY)
Admission: RE | Admit: 2024-05-16 | Discharge: 2024-05-16 | Disposition: A | Discharge status: Home / Self Care | Source: Ambulatory Visit | Attending: Gynecologic Oncology | Admitting: Gynecologic Oncology

## 2024-05-16 ENCOUNTER — Other Ambulatory Visit: Payer: Self-pay

## 2024-05-16 ENCOUNTER — Encounter (HOSPITAL_COMMUNITY): Payer: Self-pay | Admitting: Gynecologic Oncology

## 2024-05-16 DIAGNOSIS — D069 Carcinoma in situ of cervix, unspecified: Secondary | ICD-10-CM | POA: Insufficient documentation

## 2024-05-16 DIAGNOSIS — Z780 Asymptomatic menopausal state: Secondary | ICD-10-CM | POA: Insufficient documentation

## 2024-05-16 DIAGNOSIS — F17210 Nicotine dependence, cigarettes, uncomplicated: Secondary | ICD-10-CM | POA: Insufficient documentation

## 2024-05-16 DIAGNOSIS — I10 Essential (primary) hypertension: Secondary | ICD-10-CM

## 2024-05-16 DIAGNOSIS — Z79899 Other long term (current) drug therapy: Secondary | ICD-10-CM | POA: Diagnosis not present

## 2024-05-16 DIAGNOSIS — F32A Depression, unspecified: Secondary | ICD-10-CM | POA: Diagnosis not present

## 2024-05-16 HISTORY — PX: CERVICAL CONIZATION W/BX: SHX1330

## 2024-05-16 MED ORDER — DEXAMETHASONE SOD PHOSPHATE PF 10 MG/ML IJ SOLN: 4.0000 mg | INTRAMUSCULAR | Status: DC

## 2024-05-16 MED ORDER — PROPOFOL 10 MG/ML IV BOLUS
INTRAVENOUS | Status: AC
  Filled 2024-05-16: qty 20

## 2024-05-16 MED ORDER — IODINE STRONG (LUGOLS) 5 % PO SOLN
ORAL | Status: AC
  Filled 2024-05-16: qty 1

## 2024-05-16 MED ORDER — MIDAZOLAM HCL 5 MG/5ML IJ SOLN
INTRAMUSCULAR | Status: DC | PRN
  Administered 2024-05-16: 2 mg via INTRAVENOUS

## 2024-05-16 MED ORDER — KETOROLAC TROMETHAMINE 15 MG/ML IJ SOLN
INTRAMUSCULAR | Status: DC | PRN
  Administered 2024-05-16: 15 mg via INTRAVENOUS

## 2024-05-16 MED ORDER — FENTANYL CITRATE (PF) 100 MCG/2ML IJ SOLN
INTRAMUSCULAR | Status: DC | PRN
  Administered 2024-05-16 (×2): 50 ug via INTRAVENOUS

## 2024-05-16 MED ORDER — ACETIC ACID 5 % SOLN
Status: AC
  Filled 2024-05-16: qty 12

## 2024-05-16 MED ORDER — PROPOFOL 500 MG/50ML IV EMUL
INTRAVENOUS | Status: DC | PRN
  Administered 2024-05-16: 125 ug/kg/min via INTRAVENOUS

## 2024-05-16 MED ORDER — SCOPOLAMINE 1 MG/3DAYS TD PT72
1.0000 | MEDICATED_PATCH | TRANSDERMAL | Status: DC
  Administered 2024-05-16: 1 mg via TRANSDERMAL
  Filled 2024-05-16: qty 1

## 2024-05-16 MED ORDER — KETOROLAC TROMETHAMINE 30 MG/ML IJ SOLN: 30.0000 mg | Freq: Once | INTRAMUSCULAR | Status: DC | PRN

## 2024-05-16 MED ORDER — PROPOFOL 500 MG/50ML IV EMUL
INTRAVENOUS | Status: AC
  Filled 2024-05-16: qty 50

## 2024-05-16 MED ORDER — OXYCODONE HCL 5 MG PO TABS
5.0000 mg | ORAL_TABLET | Freq: Once | ORAL | Status: AC | PRN
  Administered 2024-05-16: 5 mg via ORAL

## 2024-05-16 MED ORDER — AMISULPRIDE (ANTIEMETIC) 5 MG/2ML IV SOLN: 10.0000 mg | Freq: Once | INTRAVENOUS | Status: DC | PRN

## 2024-05-16 MED ORDER — CHLORHEXIDINE GLUCONATE 0.12 % MT SOLN
15.0000 mL | Freq: Once | OROMUCOSAL | Status: AC
  Administered 2024-05-16: 15 mL via OROMUCOSAL

## 2024-05-16 MED ORDER — TRAMADOL HCL 50 MG PO TABS: 50.0000 mg | ORAL_TABLET | Freq: Four times a day (QID) | ORAL | 0 refills | Status: AC | PRN

## 2024-05-16 MED ORDER — OXYCODONE HCL 5 MG PO TABS
ORAL_TABLET | ORAL | Status: AC
  Filled 2024-05-16: qty 1

## 2024-05-16 MED ORDER — SENNOSIDES-DOCUSATE SODIUM 8.6-50 MG PO TABS: 2.0000 | ORAL_TABLET | Freq: Every day | ORAL | 0 refills | Status: AC

## 2024-05-16 MED ORDER — PROPOFOL 10 MG/ML IV BOLUS
INTRAVENOUS | Status: DC | PRN
  Administered 2024-05-16: 50 mg via INTRAVENOUS

## 2024-05-16 MED ORDER — MONSELS FERRIC SUBSULFATE EX SOLN
CUTANEOUS | Status: DC | PRN
  Administered 2024-05-16: 1 via TOPICAL

## 2024-05-16 MED ORDER — SILVER NITRATE-POT NITRATE 75-25 % EX MISC
CUTANEOUS | Status: AC
  Filled 2024-05-16: qty 10

## 2024-05-16 MED ORDER — OXYCODONE HCL 5 MG/5ML PO SOLN: 5.0000 mg | Freq: Once | ORAL | Status: AC | PRN

## 2024-05-16 MED ORDER — IODINE STRONG (LUGOLS) 5 % PO SOLN
ORAL | Status: DC | PRN
  Administered 2024-05-16: 1 mL

## 2024-05-16 MED ORDER — FENTANYL CITRATE (PF) 100 MCG/2ML IJ SOLN
INTRAMUSCULAR | Status: AC
  Filled 2024-05-16: qty 2

## 2024-05-16 MED ORDER — FENTANYL CITRATE (PF) 50 MCG/ML IJ SOSY: 25.0000 ug | PREFILLED_SYRINGE | INTRAMUSCULAR | Status: DC | PRN

## 2024-05-16 MED ORDER — ONDANSETRON HCL 4 MG/2ML IJ SOLN
INTRAMUSCULAR | Status: DC | PRN
  Administered 2024-05-16: 4 mg via INTRAVENOUS

## 2024-05-16 MED ORDER — LACTATED RINGERS IV SOLN: INTRAVENOUS | Status: DC

## 2024-05-16 MED ORDER — ORAL CARE MOUTH RINSE: 15.0000 mL | Freq: Once | OROMUCOSAL | Status: AC

## 2024-05-16 MED ORDER — HEMOSTATIC AGENTS (NO CHARGE) OPTIME
TOPICAL | Status: DC | PRN
  Administered 2024-05-16: 1

## 2024-05-16 MED ORDER — MONSELS FERRIC SUBSULFATE EX SOLN
CUTANEOUS | Status: AC
  Filled 2024-05-16: qty 8

## 2024-05-16 MED ORDER — BUPIVACAINE HCL 0.25 % IJ SOLN
INTRAMUSCULAR | Status: DC | PRN
  Administered 2024-05-16: 10 mL

## 2024-05-16 MED ORDER — MIDAZOLAM HCL 2 MG/2ML IJ SOLN
INTRAMUSCULAR | Status: AC
  Filled 2024-05-16: qty 2

## 2024-05-16 MED ORDER — KETOROLAC TROMETHAMINE 30 MG/ML IJ SOLN
INTRAMUSCULAR | Status: AC
  Filled 2024-05-16: qty 1

## 2024-05-16 MED ORDER — BUPIVACAINE HCL (PF) 0.25 % IJ SOLN
INTRAMUSCULAR | Status: AC
  Filled 2024-05-16: qty 30

## 2024-05-16 MED ORDER — ACETAMINOPHEN 500 MG PO TABS
1000.0000 mg | ORAL_TABLET | ORAL | Status: AC
  Administered 2024-05-16: 1000 mg via ORAL
  Filled 2024-05-16: qty 2

## 2024-05-16 NOTE — Op Note (Signed)
 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  Procedure: The patient was identified in the preoperative holding area. Informed consent was signed on the chart. Patient was seen history was reviewed and exam was performed.   The patient was then taken to the operating room and placed in the supine position with SCD hose on. General anesthesia was then induced without difficulty. She was then placed in the dorsolithotomy position. The perineum was prepped with CHG. The vagina was prepped with CHG. The patient was then draped after the prep was dried.  Timeout was performed the patient, procedure, antibiotic, allergy, and length of procedure.   The weighted speculum was placed in the posterior vagina. The Deaver retractor was placed anteriorally to visualize the cervix. A 0-vicryl suture was used to place stay sutures (which were tagged) at 3 and 9 o'clock of the cervicovaginal junction. 10cc of 1% lidocaine  was injected as a paracervical block at 4 and 8 o'clock at the cervicovaginal junction. The uterine sound was placed in the cervix to delineate the course of the endocervical canal. An 11 blade scalpel on a long knife handle was used to make an incision around the face of the cervix, inside of the stay sutures, circumferentially. A single tooth tenaculum grasped the specimen to manipulate it. The incision was then angled towards the endocervix to amputate the specimen. It was removed, oriented with a marking stitch at the 12 o'clock ectocervix and  sent to pathology.   A post-cone ECC was collected from the endocervical canal using a kavorkian currette.  The bovie was used at 40 coag to create hemostasis at the surgical bed. The vagina was irrigated.  Monsels solution  was applied to the surgical bed to consolidate this hemostasis.   Surgicel was then packed into the cone bed and the stay sutures were tied together loosely over the cone bed.  All instrument, suture, laparotomy, Ray-Tec, and needle counts were correct x2. The patient tolerated the procedure well and was taken recovery room in stable condition.   Comer Dollar MD Gynecologic Oncology   [name], MD

## 2024-05-16 NOTE — Anesthesia Postprocedure Evaluation (Signed)
"   Anesthesia Post Note  Patient: Gina Fuentes  Procedure(s) Performed: CONE BIOPSY, CERVIX     Patient location during evaluation: PACU Anesthesia Type: MAC Level of consciousness: awake Pain management: pain level controlled Vital Signs Assessment: post-procedure vital signs reviewed and stable Respiratory status: spontaneous breathing, nonlabored ventilation and respiratory function stable Cardiovascular status: blood pressure returned to baseline and stable Postop Assessment: no apparent nausea or vomiting Anesthetic complications: no   No notable events documented.  Last Vitals:  Vitals:   05/16/24 0936 05/16/24 0945  BP:  (!) 164/98  Pulse: 61 (!) 59  Resp: 17 13  Temp:    SpO2: 98% 99%    Last Pain:  Vitals:   05/16/24 0932  TempSrc:   PainSc: 3                  Bradon Fester P Julyssa Kyer      "

## 2024-05-16 NOTE — Discharge Instructions (Addendum)
 AFTER SURGERY INSTRUCTIONS   Return to work: 1-3 weeks if applicable   Activity: 1. Be up and out of the bed during the day.  Take a nap if needed.  You may walk up steps but be careful and use the hand rail.  Stair climbing will tire you more than you think, you may need to stop part way and rest.    2. No lifting or straining for 6 weeks over 20-25 pounds. No pushing, pulling, straining for 6 weeks.   3. No driving for 4-89 days when the following criteria have been met: Do not drive if you are taking narcotic pain medicine and make sure that your reaction time has returned.    4. You can shower as soon as the next day after surgery. Avoid tub baths for 4-6 weeks.   5. No sexual activity and nothing in the vagina for 6 weeks.   6. You may experience vaginal spotting after surgery and you may pass some material that I use to help prevent bleeding at the time of surgery.  Please call us  if you are having heavier bleeding.   7. Take Tylenol  or ibuprofen first for pain if you are able to take these medications and only use narcotic pain medication for severe pain not relieved by the Tylenol  or Ibuprofen.  Monitor your Tylenol  intake to a max of 4,000 mg in a 24 hour period. You can alternate these medications after surgery.   Diet: 1. Low sodium Heart Healthy Diet is recommended but you are cleared to resume your normal (before surgery) diet after your procedure.   2. It is safe to use a laxative, such as Miralax or Colace, if you have difficulty moving your bowels before surgery.    Reasons to call the Doctor: Fever - Oral temperature greater than 100.4 degrees Fahrenheit Foul-smelling vaginal discharge Difficulty urinating Nausea and vomiting Increased pain that is unrelieved with pain medicine. Difficulty breathing with or without chest pain New calf pain especially if only on one side Sudden, continuing increased vaginal bleeding with or without clots.   Contacts: For questions  or concerns you should contact:   Dr. Comer Dollar at [phone]   Eleanor Epps, NP at [phone]   After Hours: call [phone] and have the GYN Oncologist paged/contacted (after 5 pm or on the weekends). You will speak with an after hours RN and let he or she know you have had surgery.   Messages sent via mychart are for non-urgent matters and are not responded to after hours so for urgent needs, please call the after hours number.

## 2024-05-16 NOTE — Transfer of Care (Signed)
 Immediate Anesthesia Transfer of Care Note  Patient: Gina Fuentes  Procedure(s) Performed: CONE BIOPSY, CERVIX  Patient Location: PACU  Anesthesia Type:MAC  Level of Consciousness: awake, alert , and oriented  Airway & Oxygen Therapy: Patient Spontanous Breathing  Post-op Assessment: Report given to RN and Post -op Vital signs reviewed and stable  Post vital signs: Reviewed and stable  Last Vitals:  Vitals Value Taken Time  BP 146/86 05/16/24 09:20  Temp    Pulse 69 05/16/24 09:22  Resp 11 05/16/24 09:22  SpO2 99 % 05/16/24 09:22  Vitals shown include unfiled device data.  Last Pain:  Vitals:   05/16/24 0728  TempSrc:   PainSc: 0-No pain      Patients Stated Pain Goal: 5 (05/16/24 9277)  Complications: No notable events documented.

## 2024-05-16 NOTE — Anesthesia Preprocedure Evaluation (Addendum)
"                                    Anesthesia Evaluation  Patient identified by MRN, date of birth, ID band Patient awake    Reviewed: Allergy & Precautions, NPO status , Patient's Chart, lab work & pertinent test results  Airway Mallampati: III  TM Distance: >3 FB Neck ROM: Full    Dental no notable dental hx.    Pulmonary Current Smoker and Patient abstained from smoking.   Pulmonary exam normal        Cardiovascular hypertension, Pt. on medications Normal cardiovascular exam     Neuro/Psych  PSYCHIATRIC DISORDERS  Depression    negative neurological ROS     GI/Hepatic negative GI ROS, Neg liver ROS,,,  Endo/Other  negative endocrine ROS    Renal/GU negative Renal ROS     Musculoskeletal negative musculoskeletal ROS (+)    Abdominal   Peds  Hematology negative hematology ROS (+)   Anesthesia Other Findings CIN III  Reproductive/Obstetrics                              Anesthesia Physical Anesthesia Plan  ASA: 2  Anesthesia Plan: MAC   Post-op Pain Management:    Induction:   PONV Risk Score and Plan: 1 and Ondansetron , Dexamethasone , Propofol  infusion, Midazolam  and Treatment may vary due to age or medical condition  Airway Management Planned: Simple Face Mask  Additional Equipment:   Intra-op Plan:   Post-operative Plan:   Informed Consent: I have reviewed the patients History and Physical, chart, labs and discussed the procedure including the risks, benefits and alternatives for the proposed anesthesia with the patient or authorized representative who has indicated his/her understanding and acceptance.     Dental advisory given  Plan Discussed with: CRNA  Anesthesia Plan Comments:         Anesthesia Quick Evaluation  "

## 2024-05-16 NOTE — Interval H&P Note (Signed)
 History and Physical Interval Note:  05/16/2024 7:23 AM  Gina Fuentes  has presented today for surgery, with the diagnosis of CIN III.  The various methods of treatment have been discussed with the patient and family. After consideration of risks, benefits and other options for treatment, the patient has consented to  Procedures with comments: CONE BIOPSY, CERVIX (N/A) - ENDOCERVICAL CURETTAGE as a surgical intervention.  The patient's history has been reviewed, patient examined, no change in status, stable for surgery.  I have reviewed the patient's chart and labs.  Questions were answered to the patient's satisfaction.     [name]

## 2024-05-17 ENCOUNTER — Telehealth: Payer: Self-pay | Admitting: *Deleted

## 2024-05-17 ENCOUNTER — Encounter (HOSPITAL_COMMUNITY): Payer: Self-pay | Admitting: Gynecologic Oncology

## 2024-05-17 NOTE — Telephone Encounter (Signed)
 Spoke with Ms. Robello this morning. She states she is eating, drinking and urinating well. She has not had a BM yet but is passing gas. She is taking senokot as prescribed and encouraged her to drink plenty of water. She denies fever or chills.  She rates her pain 3/10. States she only took 1 tramadol  yesterday for pain 8/10. She hasn't needed to take anything for pain this morning.     Instructed to call office with any fever, chills, purulent drainage, uncontrolled pain or any other questions or concerns. Patient verbalizes understanding.   Pt aware of post op appointments as well as the office number [phone] and after hours number [phone] to call if she has any questions or concerns

## 2024-05-18 ENCOUNTER — Ambulatory Visit: Payer: Self-pay | Admitting: Gynecologic Oncology

## 2024-05-18 LAB — SURGICAL PATHOLOGY

## 2024-05-18 NOTE — Telephone Encounter (Signed)
-----   Message from Comer Dollar, MD sent at 05/18/2024  2:09 PM EST ----- Could you please call the patient and let her know the great news from her surgery?  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  Margins were all negative, which means all  of the precancer was removed.  Scraping from the canal of the cervix after the cone biopsy did not show any precancerous cells within that canal.

## 2024-05-18 NOTE — Telephone Encounter (Signed)
 Spoke with patient and relayed message from Dr. 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 Margins were all negative, which means all of the precancer was removed. Scraping from the canal of the cervix after the cone biopsy did not show any precancerous cells within that canal. Great News! Pt verbalized understanding and was very pleased with the results. Pt reminded of her post op appt. On 2/13 with provider. Pt thanked the office for calling.

## 2024-06-15 ENCOUNTER — Inpatient Hospital Stay: Attending: Gynecologic Oncology | Admitting: Gynecologic Oncology
